# Patient Record
Sex: Male | Born: 1980
Health system: Southern US, Community
[De-identification: ages and names within clinical notes are randomized; demographics above are authoritative.]

## PROBLEM LIST (undated history)

## (undated) DIAGNOSIS — Q7959 Other congenital malformations of abdominal wall: Secondary | ICD-10-CM

## (undated) DIAGNOSIS — Q249 Congenital malformation of heart, unspecified: Secondary | ICD-10-CM

## (undated) DIAGNOSIS — M545 Low back pain, unspecified: Secondary | ICD-10-CM

## (undated) DIAGNOSIS — M25511 Pain in right shoulder: Secondary | ICD-10-CM

## (undated) DIAGNOSIS — R109 Unspecified abdominal pain: Secondary | ICD-10-CM

## (undated) DIAGNOSIS — K5909 Other constipation: Secondary | ICD-10-CM

## (undated) DIAGNOSIS — G8929 Other chronic pain: Secondary | ICD-10-CM

## (undated) DIAGNOSIS — Q7122 Congenital absence of both forearm and hand, left upper limb: Secondary | ICD-10-CM

## (undated) HISTORY — DX: Low back pain, unspecified: M54.50

## (undated) HISTORY — DX: Other congenital malformations of abdominal wall: Q79.59

## (undated) HISTORY — DX: Other chronic pain: G89.29

## (undated) HISTORY — DX: Pain in right shoulder: M25.511

## (undated) HISTORY — DX: Unspecified abdominal pain: R10.9

## (undated) HISTORY — PX: MOUTH SURGERY: SHX715

## (undated) HISTORY — DX: Congenital malformation of heart, unspecified: Q24.9

## (undated) HISTORY — PX: INGUINAL HERNIA REPAIR: SHX194

## (undated) HISTORY — DX: Other constipation: K59.09

## (undated) HISTORY — DX: Congenital absence of both forearm and hand, left upper limb: Q71.22

---

## 2004-08-05 DIAGNOSIS — Q249 Congenital malformation of heart, unspecified: Secondary | ICD-10-CM

## 2004-08-05 HISTORY — DX: Congenital malformation of heart, unspecified: Q24.9

## 2012-02-20 ENCOUNTER — Encounter: Payer: Self-pay | Admitting: Family Medicine

## 2012-02-20 ENCOUNTER — Ambulatory Visit (INDEPENDENT_AMBULATORY_CARE_PROVIDER_SITE_OTHER): Payer: 59 | Admitting: Family Medicine

## 2012-02-20 VITALS — BP 126/84 | HR 103 | Temp 97.7°F | Ht 69.0 in | Wt 179.0 lb

## 2012-02-20 DIAGNOSIS — K5909 Other constipation: Secondary | ICD-10-CM

## 2012-02-20 DIAGNOSIS — R1084 Generalized abdominal pain: Secondary | ICD-10-CM

## 2012-02-20 DIAGNOSIS — K5900 Constipation, unspecified: Secondary | ICD-10-CM

## 2012-02-20 DIAGNOSIS — G8929 Other chronic pain: Secondary | ICD-10-CM

## 2012-02-20 LAB — CBC WITH DIFFERENTIAL/PLATELET
Basophils Relative: 0.4 % (ref 0.0–3.0)
Eosinophils Relative: 2.5 % (ref 0.0–5.0)
HCT: 43.8 % (ref 39.0–52.0)
Hemoglobin: 14.8 g/dL (ref 13.0–17.0)
Lymphs Abs: 1.7 10*3/uL (ref 0.7–4.0)
Monocytes Relative: 11.4 % (ref 3.0–12.0)
Neutro Abs: 3.5 10*3/uL (ref 1.4–7.7)
Platelets: 126 10*3/uL — ABNORMAL LOW (ref 150.0–400.0)
RBC: 4.79 Mil/uL (ref 4.22–5.81)
WBC: 6.1 10*3/uL (ref 4.5–10.5)

## 2012-02-20 LAB — COMPREHENSIVE METABOLIC PANEL
BUN: 13 mg/dL (ref 6–23)
CO2: 29 mEq/L (ref 19–32)
GFR: 82.76 mL/min (ref >60.00)
Glucose, Bld: 98 mg/dL (ref 70–99)
Sodium: 140 mEq/L (ref 135–145)
Total Bilirubin: 0.8 mg/dL (ref 0.3–1.2)
Total Protein: 7.6 g/dL (ref 6.0–8.3)

## 2012-02-20 LAB — SEDIMENTATION RATE: Sed Rate: 8 mm/hr (ref 0–22)

## 2012-02-20 NOTE — Patient Instructions (Signed)
Please give patient directions (and address) to [facility].--thx Begin taking generic/store brand senakot-S, 2 tabs every night.

## 2012-02-20 NOTE — Progress Notes (Signed)
Office Note 02/22/2012  CC:  Chief Complaint  Patient presents with  . Establish Care    stomach problems    HPI:  William Prince is a 31 y.o. White male who is here to establish care. Patient's most recent primary MD: none Old records were not reviewed prior to or during today's visit.  Complains of approx 2 yr hx of lower abd pressure and cramps--he says it is constant with periods of acute worsening lasting a minute or two.  Sometimes BM comes after cramping and he has brown, formed stool---"thinner" caliber per pt (?).  No nausea.  Usually worse in morning when he wakes up.  It is somewhat relieved by passing gas.  No known trigger.  About a year ago he had a radiologic w/u (barium enema and CT scan) but never saw MD for it.  He was told it was probably intussuseption (his wife is u/s tech and had connections in radiology so he had testing w/out having to see MD).   No blood in stool, no pus in stool.  No wt loss.   During the last 2 yrs- stool pattern: 1 BM q 2-3 days, difficult to get out.  Has tried OTC laxative pill once--he can't recall the name.   No joint problems.  No heartburn.    Past Medical History  Diagnosis Date  . Chronic abdominal pain   . 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 Congenital inguinal hernia     bilat    Past Surgical History  Procedure Date  . Inguinal hernia repair as an infant    bilateral    Family History  Problem Relation Age of Onset  . Alcohol abuse Father   . Osteoarthritis Father   . Osteoarthritis Mother   . Heart disease Paternal Grandfather   . Alcohol abuse Paternal Grandfather     History   Social History  . Marital Status: Married    Spouse Name: N/A    Number of Children: N/A  . Years of Education: N/A   Occupational History  . Not on file.   Social History Main Topics  . Smoking status: Former Games developer  . Smokeless tobacco: Never Used  . Alcohol Use: Yes  . Drug Use: No    . Sexually Active: Not on file   Other Topics Concern  . Not on file   Social History Narrative   Married, has 4 children.Occupation: currently unemployed.  Has a little bit of college education.Relocated from [place] 12/2011.Tobacco 12 pack-yr hx; quit 2010.Rare alcohol intake.  No hx of drug use.No exercise.  Tries to eat healthy diet.    Outpatient Encounter Prescriptions as of 02/20/2012  Medication Sig Dispense Refill  . Multiple Vitamin (MULTIVITAMIN) tablet Take 1 tablet by mouth daily.        No Known Allergies  ROS Review of Systems  Constitutional: Negative for fever and fatigue.  HENT: Negative for congestion and sore throat.   Eyes: Negative for visual disturbance.  Respiratory: Negative for cough.   Cardiovascular: Negative for chest pain.  Gastrointestinal: Positive for abdominal pain and constipation. Negative for nausea, diarrhea, blood in stool, abdominal distention, anal bleeding and rectal pain.  Genitourinary: Negative for dysuria, urgency and flank pain.  Musculoskeletal: Negative for back pain and joint swelling.  Skin: Negative for rash.  Neurological: Negative for weakness and headaches.  Hematological: Negative for adenopathy.  Psychiatric/Behavioral: Negative for dysphoric mood.    PE; Blood pressure  126/84, pulse 103, temperature 97.7 F (36.5 C), temperature source Temporal, height 5\' 9"  (1.753 m), weight 179 lb (81.194 kg), SpO2 97.00%. Gen: Alert, well appearing.  Patient is oriented to person, place, time, and situation. Affect: pleasant, lucid thought and speech. ENT: Eyes: no injection, icteris, swelling, or exudate.  EOMI, PERRLA. Nose: no drainage or turbinate edema/swelling.  No injection or focal lesion.  Mouth: lips without lesion/swelling.  Oral mucosa pink and moist.  Oropharynx without erythema, exudate, or swelling.  Neck - No masses or thyromegaly or limitation in range of motion CV: RRR, no m/r/g.   LUNGS: CTA bilat, nonlabored  resps, good aeration in all lung fields. ABD: soft, nondistended, diffusely TTP except in RUQ or subxyphoid regions.  No guarding or rebound.  BS normal.  No HSM or bruit. EXT: no c/c/e.  Left arm--absence of forearm and hand. Musculoskeletal: no joint swelling, erythema, warmth, or tenderness.  ROM of all joints intact. Skin - no sores or suspicious lesions or rashes or color changes  Pertinent labs:  None today  ASSESSMENT AND PLAN:   New pt: no old records available (question of whether or not he or his wife could track down the imaging tests he got out of state.  Chronic generalized abdominal pain Sounds like functional GI pain. Will check plain x-rays--upright and supine--of abdomen for starters. Will check CBC, CMET, lipase, ESR, celiac panel, and hemoccults x 3. He does have chronic constipation, which I think is likely a separate issue, unlikely the cause of his current pain problems, but I recommended he start senakot-S generic 2 tabs qhs. Anticipate referral to GI for help with further evaluation.    Return if symptoms worsen or fail to improve.

## 2012-02-22 ENCOUNTER — Encounter: Payer: Self-pay | Admitting: Family Medicine

## 2012-02-22 DIAGNOSIS — K5909 Other constipation: Secondary | ICD-10-CM | POA: Insufficient documentation

## 2012-02-22 DIAGNOSIS — G8929 Other chronic pain: Secondary | ICD-10-CM | POA: Insufficient documentation

## 2012-02-22 LAB — GLIADIN ANTIBODIES, SERUM: Gliadin IgA: 3 U/mL (ref <20)

## 2012-02-22 NOTE — Assessment & Plan Note (Signed)
Sounds like functional GI pain. Will check plain x-rays--upright and supine--of abdomen for starters. Will check CBC, CMET, lipase, ESR, celiac panel, and hemoccults x 3. He does have chronic constipation, which I think is likely a separate issue, unlikely the cause of his current pain problems, but I recommended he start senakot-S generic 2 tabs qhs. Anticipate referral to GI for help with further evaluation.

## 2012-02-25 ENCOUNTER — Encounter: Payer: Self-pay | Admitting: Family Medicine

## 2012-02-25 ENCOUNTER — Ambulatory Visit (HOSPITAL_BASED_OUTPATIENT_CLINIC_OR_DEPARTMENT_OTHER)
Admission: RE | Admit: 2012-02-25 | Discharge: 2012-02-25 | Disposition: A | Discharge status: Home / Self Care | Payer: 59 | Source: Ambulatory Visit | Attending: Family Medicine | Admitting: Family Medicine

## 2012-02-25 DIAGNOSIS — G8929 Other chronic pain: Secondary | ICD-10-CM

## 2012-02-25 DIAGNOSIS — K5900 Constipation, unspecified: Secondary | ICD-10-CM | POA: Insufficient documentation

## 2012-02-25 DIAGNOSIS — R109 Unspecified abdominal pain: Secondary | ICD-10-CM | POA: Insufficient documentation

## 2012-02-25 DIAGNOSIS — K5909 Other constipation: Secondary | ICD-10-CM

## 2013-05-17 ENCOUNTER — Encounter: Payer: Self-pay | Admitting: Family Medicine

## 2013-05-17 ENCOUNTER — Ambulatory Visit (INDEPENDENT_AMBULATORY_CARE_PROVIDER_SITE_OTHER): Payer: 59 | Admitting: Family Medicine

## 2013-05-17 VITALS — BP 125/85 | HR 89 | Temp 98.6°F | Resp 16 | Ht 69.0 in | Wt 181.0 lb

## 2013-05-17 DIAGNOSIS — H6121 Impacted cerumen, right ear: Secondary | ICD-10-CM

## 2013-05-17 DIAGNOSIS — H9209 Otalgia, unspecified ear: Secondary | ICD-10-CM

## 2013-05-17 DIAGNOSIS — H9201 Otalgia, right ear: Secondary | ICD-10-CM

## 2013-05-17 DIAGNOSIS — H6120 Impacted cerumen, unspecified ear: Secondary | ICD-10-CM | POA: Insufficient documentation

## 2013-05-17 NOTE — Progress Notes (Signed)
OFFICE NOTE  05/17/2013  CC:  Chief Complaint  Patient presents with  . Otalgia    X Saturday right sided     HPI: Patient is a 32 y.o. Caucasian male who is here for right ear pain.   Onset about 2 d/a.  Hurts to touch it.  No recent cold/allergy sx's. Uses Q tips.  No ear drainage.  No fever.  No rash/color change to ear.  No ST.   Pertinent PMH:  Past Medical History  Diagnosis Date  . Chronic abdominal pain   . 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 Congenital inguinal hernia     bilat  . Chronic constipation    Past surgical, social, and family history reviewed and no changes noted since last office visit.  MEDS:  Outpatient Prescriptions Prior to Visit  Medication Sig Dispense Refill  . Multiple Vitamin (MULTIVITAMIN) tablet Take 1 tablet by mouth daily.       No facility-administered medications prior to visit.    PE: Blood pressure 125/85, pulse 89, temperature 98.6 F (37 C), temperature source Temporal, resp. rate 16, height 5\' 9"  (1.753 m), weight 181 lb (82.101 kg), SpO2 98.00%. ENT: Ears: Left EACs clear, normal epithelium, TM with good light reflex and landmarks.   Right EAC with no significant swelling or erythema or exudate of EAC.  He has a distal EAC cerumen impaction that I cannot see past.  The cerumen appears dark, hard/firm, and it appears to be adherent to the walls of his EAC.  He has tenderness to palpation of external ear and mild tenderness under the ear near the angle of the mandible.  Eyes: no injection, icteris, swelling, or exudate.  EOMI, PERRLA. Nose: no drainage or turbinate edema/swelling.  No injection or focal lesion.  Mouth: lips without lesion/swelling.  Oral mucosa pink and moist.    Oropharynx without erythema, exudate, or swelling.  Neck - No masses or thyromegaly or limitation in range of motion  IMPRESSION AND PLAN:  Otalgia of right ear I think this is due solely to his cerumen  impaction. Discussed cessation of Q tip use. Irrigation of right ear done today but was unsuccessful. He is in a lot of pain.  We have arranged for him to see ENT this afternoon so they can get this cleared out for him and also get a look at his TM/middle ear.   An After Visit Summary was printed and given to the patient.  FOLLOW UP: prn

## 2013-05-17 NOTE — Assessment & Plan Note (Addendum)
I think this is due solely to his cerumen impaction. Discussed cessation of Q tip use. Irrigation of right ear done today but was unsuccessful. He is in a lot of pain.  We have arranged for him to see ENT this afternoon so they can get this cleared out for him and also get a look at his TM/middle ear.

## 2013-09-16 ENCOUNTER — Telehealth: Payer: Self-pay | Admitting: Family Medicine

## 2013-09-16 NOTE — Telephone Encounter (Signed)
OK to transfer if OK with Dr Anitra Lauth

## 2013-09-16 NOTE — Telephone Encounter (Signed)
Patient would like to transfer care to Dr. Charlett Blake. Is this ok? Thanks!

## 2013-09-16 NOTE — Telephone Encounter (Signed)
Yes, fine with me. 

## 2013-09-17 NOTE — Telephone Encounter (Signed)
Left detailed message for patient wife to call the office to schedule new patient appointment with Dr. Charlett Blake

## 2013-12-14 ENCOUNTER — Ambulatory Visit: Payer: 59 | Admitting: Family Medicine

## 2014-01-14 ENCOUNTER — Ambulatory Visit: Payer: 59 | Admitting: Family Medicine

## 2014-01-14 DIAGNOSIS — Z0289 Encounter for other administrative examinations: Secondary | ICD-10-CM

## 2014-03-07 ENCOUNTER — Ambulatory Visit: Payer: 59 | Admitting: Family Medicine

## 2014-03-09 ENCOUNTER — Ambulatory Visit (INDEPENDENT_AMBULATORY_CARE_PROVIDER_SITE_OTHER): Payer: 59 | Admitting: Family Medicine

## 2014-03-09 ENCOUNTER — Encounter: Payer: Self-pay | Admitting: Family Medicine

## 2014-03-09 VITALS — BP 130/81 | HR 83 | Temp 98.8°F | Resp 18 | Ht 69.0 in | Wt 156.0 lb

## 2014-03-09 DIAGNOSIS — F32A Depression, unspecified: Secondary | ICD-10-CM | POA: Insufficient documentation

## 2014-03-09 DIAGNOSIS — R634 Abnormal weight loss: Secondary | ICD-10-CM | POA: Insufficient documentation

## 2014-03-09 DIAGNOSIS — R109 Unspecified abdominal pain: Secondary | ICD-10-CM

## 2014-03-09 DIAGNOSIS — F329 Major depressive disorder, single episode, unspecified: Secondary | ICD-10-CM | POA: Insufficient documentation

## 2014-03-09 DIAGNOSIS — F341 Dysthymic disorder: Secondary | ICD-10-CM

## 2014-03-09 DIAGNOSIS — F419 Anxiety disorder, unspecified: Secondary | ICD-10-CM

## 2014-03-09 DIAGNOSIS — R1030 Lower abdominal pain, unspecified: Secondary | ICD-10-CM

## 2014-03-09 LAB — COMPREHENSIVE METABOLIC PANEL
ALT: 18 U/L (ref 0–53)
AST: 18 U/L (ref 0–37)
Albumin: 4.7 g/dL (ref 3.5–5.2)
Alkaline Phosphatase: 45 U/L (ref 39–117)
BUN: 13 mg/dL (ref 6–23)
CO2: 28 mEq/L (ref 19–32)
CREATININE: 1 mg/dL (ref 0.4–1.5)
Calcium: 9.8 mg/dL (ref 8.4–10.5)
Chloride: 102 mEq/L (ref 96–112)
GFR: 89.01 mL/min (ref >60.00)
GLUCOSE: 81 mg/dL (ref 70–99)
Potassium: 4.4 mEq/L (ref 3.5–5.1)
Sodium: 138 mEq/L (ref 135–145)
Total Bilirubin: 1.1 mg/dL (ref 0.2–1.2)
Total Protein: 7.2 g/dL (ref 6.0–8.3)

## 2014-03-09 LAB — CBC WITH DIFFERENTIAL/PLATELET
BASOS ABS: 0 10*3/uL (ref 0.0–0.1)
Basophils Relative: 0.4 % (ref 0.0–3.0)
EOS PCT: 0.8 % (ref 0.0–5.0)
Eosinophils Absolute: 0 10*3/uL (ref 0.0–0.7)
HCT: 41.9 % (ref 39.0–52.0)
Hemoglobin: 14.1 g/dL (ref 13.0–17.0)
LYMPHS ABS: 1.4 10*3/uL (ref 0.7–4.0)
Lymphocytes Relative: 24.3 % (ref 12.0–46.0)
MCHC: 33.5 g/dL (ref 30.0–36.0)
MCV: 91.3 fl (ref 78.0–100.0)
MONOS PCT: 8.1 % (ref 3.0–12.0)
Monocytes Absolute: 0.5 10*3/uL (ref 0.1–1.0)
NEUTROS ABS: 4 10*3/uL (ref 1.4–7.7)
Neutrophils Relative %: 66.4 % (ref 43.0–77.0)
Platelets: 137 10*3/uL — ABNORMAL LOW (ref 150.0–400.0)
RBC: 4.58 Mil/uL (ref 4.22–5.81)
RDW: 13.1 % (ref 11.5–15.5)
WBC: 6 10*3/uL (ref 4.0–10.5)

## 2014-03-09 LAB — C-REACTIVE PROTEIN

## 2014-03-09 LAB — SEDIMENTATION RATE: SED RATE: 5 mm/h (ref 0–22)

## 2014-03-09 LAB — TSH: TSH: 1.12 u[IU]/mL (ref 0.35–4.50)

## 2014-03-09 LAB — LIPASE: Lipase: 47 U/L (ref 11.0–59.0)

## 2014-03-09 NOTE — Assessment & Plan Note (Signed)
Chronic, now with loss of appetite and significant wt loss. Will check some general labs again today: CBC, CMET, TSH, ESR/CRP, lipase, plus get hemoccults x 3. We have done similar testing in the past plus blood testing for celiac dz and all were unremarkable.   I will check an abd/pelvic CT with contrast. If my w/u is unrevealing then I will ask GI to see him for further evaluation and management.

## 2014-03-09 NOTE — Assessment & Plan Note (Signed)
Pt says this is mild, says he is not interested in meds. We agreed this may impact his appetite and occ lead to wt loss, but this would be a diagnosis of exclusion---must r/o organic dz first.

## 2014-03-09 NOTE — Progress Notes (Signed)
Pre visit review using our clinic review tool, if applicable. No additional management support is needed unless otherwise documented below in the visit note. 

## 2014-03-09 NOTE — Progress Notes (Signed)
OFFICE VISIT  03/09/2014  CC:  Chief Complaint  Patient presents with  . Abdominal Pain  . Weight Loss  . Weight Loss    around 30 lbs in the last 3 months   HPI:    Patient is a 33 y.o. Caucasian male who presents for ongoing wt loss. He has a history of chronic/recurrent pain in lower abd + chronic constipation--this is unchanged.  Describes a constant nagging pain with some intermittent worsening.   He feels a firmness in LLQ.  Has one BM qd or qod, sometimes it is hard but usually it is not.  Stool is brown.  No n/v/d. New sx's: last couple months has had markedly decreased appetite, forces himself to eat.  He doesn't note any particular pattern/trigger to his abd pains, no partic types of foods are intolerable compared to others.   Wt loss over the last 3-4 mo has been remarkable: 25-30 lbs--NONPURPOSEFUL.   Feels drained and fatigued all the time.  No fevers.    Past Medical History  Diagnosis Date  . Chronic abdominal pain   . 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 Congenital inguinal hernia     bilat  . Chronic constipation     Past Surgical History  Procedure Laterality Date  . Inguinal hernia repair  as an infant    bilateral    MEDS: none  Allergies  Allergen Reactions  . Naproxen     Patient had burning pain in stomach, had to have his stomach pumped.    ROS Review of Systems  Constitutional: Positive for appetite change and fatigue. Negative for fever.  HENT: Negative for congestion, hearing loss, mouth sores, sinus pressure and trouble swallowing.   Eyes: Negative for visual disturbance.  Respiratory: Negative for cough and shortness of breath.   Cardiovascular: Negative for chest pain, palpitations and leg swelling.  Gastrointestinal: Positive for nausea (occasional) and constipation. Negative for vomiting, diarrhea, blood in stool and abdominal distention.  Endocrine: Negative for polydipsia and polyuria.   Genitourinary: Negative for dysuria, frequency and hematuria.  Musculoskeletal: Negative for arthralgias and myalgias.  Skin: Negative for rash.  Neurological: Positive for headaches (occ). Negative for dizziness and weakness.  Psychiatric/Behavioral: Positive for suicidal ideas. Negative for sleep disturbance (hard to maintain sleep), dysphoric mood and decreased concentration. The patient is not nervous/anxious (no panic).      PE: Blood pressure 130/81, pulse 83, temperature 98.8 F (37.1 C), temperature source Temporal, resp. rate 18, height '5\' 9"'  (1.753 m), weight 156 lb (70.761 kg), SpO2 98.00%. Gen: Alert, well appearing.  Patient is oriented to person, place, time, and situation. YSH:UOHF: no injection, icteris, swelling, or exudate.  EOMI, PERRLA. Mouth: lips without lesion/swelling.  Oral mucosa pink and moist. Oropharynx without erythema, exudate, or swelling.  Neck - No masses or thyromegaly or limitation in range of motion CV: RRR, no m/r/g.   LUNGS: CTA bilat, nonlabored resps, good aeration in all lung fields. ABD: soft, nondistended.  BS normal.  No HSM or mass.  He has significant TTP across mid abdomen and in both lower quadrants diffusely, with some voluntary guarding but no rebound.  He has mid-epigastric area TTP.  Not tender in RUQ or LUQ. EXT: no clubbing, cyanosis, or edema.  Skin - no sores or suspicious lesions or rashes or color changes Musculoskeletal: no joint swelling, erythema, warmth, or tenderness.  ROM of all joints intact.   LABS:  None today  IMPRESSION  AND PLAN:  Lower abdominal pain Chronic, now with loss of appetite and significant wt loss. Will check some general labs again today: CBC, CMET, TSH, ESR/CRP, lipase, plus get hemoccults x 3. We have done similar testing in the past plus blood testing for celiac dz and all were unremarkable.   I will check an abd/pelvic CT with contrast. If my w/u is unrevealing then I will ask GI to see him for  further evaluation and management.  Anxiety and depression Pt says this is mild, says he is not interested in meds. We agreed this may impact his appetite and occ lead to wt loss, but this would be a diagnosis of exclusion---must r/o organic dz first.   An After Visit Summary was printed and given to the patient.  FOLLOW UP: Return in about 1 month (around 04/09/2014) for f/u abd pain/wt loss.

## 2014-04-06 ENCOUNTER — Ambulatory Visit: Payer: 59 | Admitting: Family Medicine

## 2014-04-06 DIAGNOSIS — Z0289 Encounter for other administrative examinations: Secondary | ICD-10-CM

## 2014-04-08 ENCOUNTER — Other Ambulatory Visit: Payer: Self-pay | Admitting: Family Medicine

## 2014-04-08 ENCOUNTER — Ambulatory Visit (HOSPITAL_BASED_OUTPATIENT_CLINIC_OR_DEPARTMENT_OTHER)
Admission: RE | Admit: 2014-04-08 | Discharge: 2014-04-08 | Disposition: A | Discharge status: Home / Self Care | Payer: 59 | Source: Ambulatory Visit | Attending: Family Medicine | Admitting: Family Medicine

## 2014-04-08 DIAGNOSIS — R634 Abnormal weight loss: Secondary | ICD-10-CM

## 2014-04-08 DIAGNOSIS — R1030 Lower abdominal pain, unspecified: Secondary | ICD-10-CM

## 2014-04-08 DIAGNOSIS — R109 Unspecified abdominal pain: Secondary | ICD-10-CM

## 2014-04-08 MED ORDER — IOHEXOL 300 MG/ML  SOLN
100.0000 mL | Freq: Once | INTRAMUSCULAR | Status: AC | PRN
  Administered 2014-04-08: 100 mL via INTRAVENOUS

## 2014-08-16 ENCOUNTER — Telehealth: Payer: Self-pay | Admitting: Family Medicine

## 2014-08-16 DIAGNOSIS — R634 Abnormal weight loss: Secondary | ICD-10-CM

## 2014-08-16 DIAGNOSIS — R109 Unspecified abdominal pain: Secondary | ICD-10-CM

## 2014-08-16 NOTE — Telephone Encounter (Signed)
Yes.  New GI referral order entered.

## 2014-08-16 NOTE — Telephone Encounter (Signed)
Patient does not check his phone for messages. His wife just checked & noticed that [facility] has been trying to schedule a an appt. Patient does want to have colonoscopy however referral has been cancelled. Can a new referral be put in?

## 2014-08-16 NOTE — Telephone Encounter (Signed)
Please advise 

## 2014-08-17 ENCOUNTER — Encounter: Payer: Self-pay | Admitting: Internal Medicine

## 2014-08-24 ENCOUNTER — Ambulatory Visit (INDEPENDENT_AMBULATORY_CARE_PROVIDER_SITE_OTHER): Payer: 59 | Admitting: Internal Medicine

## 2014-08-24 ENCOUNTER — Other Ambulatory Visit (INDEPENDENT_AMBULATORY_CARE_PROVIDER_SITE_OTHER): Payer: 59

## 2014-08-24 ENCOUNTER — Encounter: Payer: Self-pay | Admitting: Internal Medicine

## 2014-08-24 VITALS — BP 122/74 | HR 84 | Ht 68.5 in | Wt 153.0 lb

## 2014-08-24 DIAGNOSIS — R194 Change in bowel habit: Secondary | ICD-10-CM

## 2014-08-24 DIAGNOSIS — R109 Unspecified abdominal pain: Secondary | ICD-10-CM

## 2014-08-24 DIAGNOSIS — R634 Abnormal weight loss: Secondary | ICD-10-CM

## 2014-08-24 LAB — CBC WITH DIFFERENTIAL/PLATELET
BASOS PCT: 0.5 % (ref 0.0–3.0)
Basophils Absolute: 0 10*3/uL (ref 0.0–0.1)
EOS PCT: 1.4 % (ref 0.0–5.0)
Eosinophils Absolute: 0.1 10*3/uL (ref 0.0–0.7)
HEMATOCRIT: 43.8 % (ref 39.0–52.0)
Hemoglobin: 15.2 g/dL (ref 13.0–17.0)
Lymphocytes Relative: 25.9 % (ref 12.0–46.0)
Lymphs Abs: 1.8 10*3/uL (ref 0.7–4.0)
MCHC: 34.8 g/dL (ref 30.0–36.0)
MCV: 89.2 fl (ref 78.0–100.0)
MONO ABS: 0.6 10*3/uL (ref 0.1–1.0)
Monocytes Relative: 8.4 % (ref 3.0–12.0)
NEUTROS PCT: 63.8 % (ref 43.0–77.0)
Neutro Abs: 4.4 10*3/uL (ref 1.4–7.7)
Platelets: 149 10*3/uL — ABNORMAL LOW (ref 150.0–400.0)
RBC: 4.91 Mil/uL (ref 4.22–5.81)
RDW: 13.3 % (ref 11.5–15.5)
WBC: 6.8 10*3/uL (ref 4.0–10.5)

## 2014-08-24 LAB — IGA: IGA: 133 mg/dL (ref 68–378)

## 2014-08-24 LAB — HIGH SENSITIVITY CRP: CRP, High Sensitivity: 0.63 mg/L (ref 0.000–5.000)

## 2014-08-24 MED ORDER — MOVIPREP 100 G PO SOLR: 1.0000 | Freq: Once | ORAL | Status: DC

## 2014-08-24 NOTE — Progress Notes (Signed)
Patient ID: William Prince, male   DOB: August 08, 1980, 34 y.o.   MRN: [id number] HPI: William Prince is a 34 yo male with PMH of chronic abdominal pain, anxiety who is seen in consultation at the request of Dr. Anitra Lauth to evaluate weight loss, change in bowel habits and abdominal pain. He is here alone today. He reports he's had somewhat long-standing diffuse abdominal pain, though this seems to be worse of late. He describes it as a nagging and gnawing mid and at times diffuse abdominal discomfort. It is there almost every day and occasionally gets worse with eating. It does seem to get better after bowel movement. His appetite has fluctuated greatly and he reports at times he can eat full meals and at other times he is not hungry and skips meals. He has lost about 30 pounds in the last 6-7 months and this is unintentional. He reports his stools have changed to become flat and ribbonlike with a different odor. He reports they are brown in color and denies blood in his stool or melena. He feels occasionally like pressing on his abdomen helps with passing his bowel movements. He did have a CT scan performed by primary care in September which was unremarkable. He reports frequent nocturnal urination 3 times a night which is new for him but denies dysuria or hematuria. He has some mild night sweats but not drenching requiring change of clothes or bedding  Family history is fairly unremarkable from a GI perspective. He is married with 5 children  Past Medical History  Diagnosis Date  . Chronic abdominal pain   . 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 Congenital inguinal hernia     bilat  . Chronic constipation   . Cardiac arrhythmia due to congenital heart disease 2006    Past Surgical History  Procedure Laterality Date  . Inguinal hernia repair Bilateral as an infant    bilateral  . Mouth surgery      Outpatient Prescriptions Prior to Visit  Medication Sig Dispense  Refill  . Multiple Vitamin (MULTIVITAMIN) tablet Take 1 tablet by mouth daily.     No facility-administered medications prior to visit.    Allergies  Allergen Reactions  . Naproxen     Patient had burning pain in stomach, had to have his stomach pumped.    Family History  Problem Relation Age of Onset  . Osteoarthritis Father   . Osteoarthritis Mother   . Heart disease Paternal Grandfather   . Alcohol abuse Paternal Grandfather   . Kidney disease Paternal Grandfather   . Colon cancer Neg Hx   . Colon polyps Neg Hx   . Esophageal cancer Neg Hx   . Gallbladder disease Neg Hx   . Cholelithiasis Mother     History  Substance Use Topics  . Smoking status: Former Smoker -- 1.50 packs/day for 15 years    Types: Cigarettes    Quit date: 08/05/2005  . Smokeless tobacco: Never Used  . Alcohol Use: 0.0 oz/week    0 Not specified per week     Comment: Occassionally    ROS: As per history of present illness, otherwise negative  BP 122/74 mmHg  Pulse 84  Ht 5' 8.5" (1.74 m)  Wt 153 lb (69.4 kg)  BMI 22.92 kg/m2 Constitutional: Well-developed and well-nourished. No distress. HEENT: Normocephalic and atraumatic. Oropharynx is clear and moist. No oropharyngeal exudate. Conjunctivae are normal.  No scleral icterus. Neck: Neck supple. Trachea midline.  Cardiovascular: Normal rate, regular rhythm and intact distal pulses. No M/R/G Pulmonary/chest: Effort normal and breath sounds normal. No wheezing, rales or rhonchi. Abdominal: Soft, diffuse tenderness without rebound or guarding, nondistended. Bowel sounds active throughout. There are no masses palpable. No hepatosplenomegaly. Extremities: no clubbing, cyanosis, or edema, congenital absence of the left forearm and hand Lymphadenopathy: No cervical adenopathy noted. Neurological: Alert and oriented to person place and time. Skin: Skin is warm and dry. No rashes noted. Psychiatric: Normal mood and affect. Behavior is  normal.  RELEVANT LABS AND IMAGING: CBC    Component Value Date/Time   WBC 6.8 08/24/2014 1103   RBC 4.91 08/24/2014 1103   HGB 15.2 08/24/2014 1103   HCT 43.8 08/24/2014 1103   PLT 149.0* 08/24/2014 1103   MCV 89.2 08/24/2014 1103   MCHC 34.8 08/24/2014 1103   RDW 13.3 08/24/2014 1103   LYMPHSABS 1.8 08/24/2014 1103   MONOABS 0.6 08/24/2014 1103   EOSABS 0.1 08/24/2014 1103   BASOSABS 0.0 08/24/2014 1103    CMP     Component Value Date/Time   NA 138 03/09/2014 1150   K 4.4 03/09/2014 1150   CL 102 03/09/2014 1150   CO2 28 03/09/2014 1150   GLUCOSE 81 03/09/2014 1150   BUN 13 03/09/2014 1150   CREATININE 1.0 03/09/2014 1150   CALCIUM 9.8 03/09/2014 1150   PROT 7.2 03/09/2014 1150   ALBUMIN 4.7 03/09/2014 1150   AST 18 03/09/2014 1150   ALT 18 03/09/2014 1150   ALKPHOS 45 03/09/2014 1150   BILITOT 1.1 03/09/2014 1150   CLINICAL DATA:  Chronic lower abdominal pain.  30 lb weight loss.   EXAM: CT ABDOMEN AND PELVIS WITH CONTRAST   TECHNIQUE: Multidetector CT imaging of the abdomen and pelvis was performed using the standard protocol following bolus administration of intravenous contrast.   CONTRAST:  153mL OMNIPAQUE IOHEXOL 300 MG/ML  SOLN   COMPARISON:  Plain film 02/25/2012   FINDINGS: Lung bases:Lung bases are clear. heart is normal size. No effusions.   Solid organs: Liver, spleen, pancreas, adrenals, kidneys are unremarkable.   Gallbladder/Biliary:  Unremarkable.   Bowel/Peritoneum: No evidence of bowel wall thickening, mass, or obstruction. Normal appendix.   Pelvic/Reproductive Organs: No mass or other significant abnormality identified.   Aorta/retroperitoneum: Aorta is normal caliber. No adenopathy.   Musculoskeletal:  No suspicious bone lesions identified.   Other:  None.   IMPRESSION: Unremarkable abdominal/pelvic CT.     Electronically Signed   By: Rolm Baptise M.D.   On: 04/08/2014 10:52  ASSESSMENT/PLAN: 34 yo male with PMH  of chronic abdominal pain, anxiety who is seen in consultation at the request of Dr. Anitra Lauth to evaluate weight loss, change in bowel habits and abdominal pain.  1. Abd pain/change in bowel habits/unintentional weight loss -- his weight loss is concerning and unexplained. Labs performed in September were unremarkable. His CT scan was reviewed and unremarkable. I have recommended celiac panel as well as a repeat CBC. I will check high-sensitivity CRP is a marker of inflammation. Given his symptomatology I recommended upper and lower endoscopy for direct visualization to exclude inflammatory bowel disease but also an occult malignancy. The latter is felt less likely given his normal blood counts though change in bowel habit and weight loss again are concerning. We discussed the test including the risks and benefits and he is agreeable to proceed. Irritable bowel is likely component to some of his symptoms possibly exacerbated by anxiety though weight loss would certainly not be  expected with IBS alone.

## 2014-08-24 NOTE — Patient Instructions (Signed)
You have been scheduled for an endoscopy and colonoscopy. Please follow the written instructions given to you at your visit today. Please pick up your prep at the pharmacy within the next 1-3 days. If you use inhalers (even only as needed), please bring them with you on the day of your procedure. Your physician has requested that you go to www.startemmi.com and enter the access code given to you at your visit today. This web site gives a general overview about your procedure. However, you should still follow specific instructions given to you by our office regarding your preparation for the procedure.  Your physician has requested that you go to the basement for the following lab work before leaving today: Celiac, CRP, CBC  CC:Dr McGowen

## 2014-08-25 LAB — TISSUE TRANSGLUTAMINASE, IGA: Tissue Transglutaminase Ab, IgA: 1 U/mL (ref <4)

## 2014-09-26 ENCOUNTER — Ambulatory Visit (AMBULATORY_SURGERY_CENTER): Payer: 59 | Admitting: Internal Medicine

## 2014-09-26 ENCOUNTER — Encounter: Payer: Self-pay | Admitting: Internal Medicine

## 2014-09-26 VITALS — BP 113/50 | HR 79 | Temp 97.3°F | Resp 12 | Ht 68.0 in | Wt 153.0 lb

## 2014-09-26 DIAGNOSIS — D120 Benign neoplasm of cecum: Secondary | ICD-10-CM

## 2014-09-26 DIAGNOSIS — K2970 Gastritis, unspecified, without bleeding: Secondary | ICD-10-CM

## 2014-09-26 DIAGNOSIS — R1084 Generalized abdominal pain: Secondary | ICD-10-CM

## 2014-09-26 DIAGNOSIS — K2990 Gastroduodenitis, unspecified, without bleeding: Secondary | ICD-10-CM

## 2014-09-26 DIAGNOSIS — K2950 Unspecified chronic gastritis without bleeding: Secondary | ICD-10-CM

## 2014-09-26 DIAGNOSIS — G8929 Other chronic pain: Secondary | ICD-10-CM

## 2014-09-26 DIAGNOSIS — R634 Abnormal weight loss: Secondary | ICD-10-CM

## 2014-09-26 DIAGNOSIS — R109 Unspecified abdominal pain: Secondary | ICD-10-CM

## 2014-09-26 HISTORY — PX: COLONOSCOPY: SHX174

## 2014-09-26 HISTORY — PX: ESOPHAGOGASTRODUODENOSCOPY: SHX1529

## 2014-09-26 MED ORDER — SODIUM CHLORIDE 0.9 % IV SOLN: 500.0000 mL | INTRAVENOUS | Status: DC

## 2014-09-26 NOTE — Progress Notes (Signed)
A/ox3, pleased with MAC, report to RN 

## 2014-09-26 NOTE — Op Note (Signed)
[facility]  [address]. [place], [address]   ENDOSCOPY PROCEDURE REPORT  PATIENT: William Prince, William Prince  MR#: [id number] BIRTHDATE: March 03, 1981 , 33  yrs. old GENDER: male ENDOSCOPIST: Jerene Bears, MD REFERRED BY:  Ricardo Jericho, M.D. PROCEDURE DATE:  09/26/2014 PROCEDURE:  EGD w/ biopsy for H.pylori ASA CLASS:     Class II INDICATIONS:  weight loss and abdominal pain. MEDICATIONS: Monitored anesthesia care and Propofol 250 mg IV TOPICAL ANESTHETIC: none  DESCRIPTION OF PROCEDURE: After the risks benefits and alternatives of the procedure were thoroughly explained, informed consent was obtained.  The LB [id number] endoscope was introduced through the mouth and advanced to the second portion of the duodenum , Without limitations.  The instrument was slowly withdrawn as the mucosa was fully examined.   ESOPHAGUS: The mucosa of the esophagus appeared normal.  STOMACH: Mild gastritis (inflammation) with several erosions was found in the gastric antrum.  Cold forcep biopsies were taken at the gastric body, antrum and angularis to evaluate for h.  pylori.   DUODENUM: The duodenal mucosa showed no abnormalities in the bulb and 2nd part of the duodenum.  Retroflexed views revealed no abnormalities.     The scope was then withdrawn from the patient and the procedure completed.  COMPLICATIONS: There were no immediate complications.  ENDOSCOPIC IMPRESSION: 1.   The mucosa of the esophagus appeared normal 2.   Gastritis (inflammation) was found in the gastric antrum; multiple biopsies 3.   The duodenal mucosa showed no abnormalities in the bulb and 2nd part of the duodenum  RECOMMENDATIONS: 1.  Await biopsy results 2.  Proceed with a Colonoscopy.  [eSigned:  Jerene Bears, MD 09/26/2014 2:03 PM   MB:[id number] McGowen, MD and The Patient

## 2014-09-26 NOTE — Patient Instructions (Signed)
YOU HAD AN ENDOSCOPIC PROCEDURE TODAY AT THE [facility]: Refer to the procedure report that was given to you for any specific questions about what was found during the examination.  If the procedure report does not answer your questions, please call your gastroenterologist to clarify.  If you requested that your care partner not be given the details of your procedure findings, then the procedure report has been included in a sealed envelope for you to review at your convenience later.  YOU SHOULD EXPECT: Some feelings of bloating in the abdomen. Passage of more gas than usual.  Walking can help get rid of the air that was put into your GI tract during the procedure and reduce the bloating. If you had a lower endoscopy (such as a colonoscopy or flexible sigmoidoscopy) you may notice spotting of blood in your stool or on the toilet paper. If you underwent a bowel prep for your procedure, then you may not have a normal bowel movement for a few days.  DIET: Your first meal following the procedure should be a light meal and then it is ok to progress to your normal diet.  A half-sandwich or bowl of soup is an example of a good first meal.  Heavy or fried foods are harder to digest and may make you feel nauseous or bloated.  Likewise meals heavy in dairy and vegetables can cause extra gas to form and this can also increase the bloating.  Drink plenty of fluids but you should avoid alcoholic beverages for 24 hours.  ACTIVITY: Your care partner should take you home directly after the procedure.  You should plan to take it easy, moving slowly for the rest of the day.  You can resume normal activity the day after the procedure however you should NOT DRIVE or use heavy machinery for 24 hours (because of the sedation medicines used during the test).    SYMPTOMS TO REPORT IMMEDIATELY: A gastroenterologist can be reached at any hour.  During normal business hours, 8:30 AM to 5:00 PM Monday through Friday,  call [phone].  After hours and on weekends, please call the GI answering service at [phone] who will take a message and have the physician on call contact you.   Following lower endoscopy (colonoscopy or flexible sigmoidoscopy):  Excessive amounts of blood in the stool  Significant tenderness or worsening of abdominal pains  Swelling of the abdomen that is new, acute  Fever of 100F or higher  Following upper endoscopy (EGD)  Vomiting of blood or coffee ground material  New chest pain or pain under the shoulder blades  Painful or persistently difficult swallowing  New shortness of breath  Fever of 100F or higher  Black, tarry-looking stools  FOLLOW UP: If any biopsies were taken you will be contacted by phone or by letter within the next 1-3 weeks.  Call your gastroenterologist if you have not heard about the biopsies in 3 weeks.  Our staff will call the home number listed on your records the next business day following your procedure to check on you and address any questions or concerns that you may have at that time regarding the information given to you following your procedure. This is a courtesy call and so if there is no answer at the home number and we have not heard from you through the emergency physician on call, we will assume that you have returned to your regular daily activities without incident.  SIGNATURES/CONFIDENTIALITY: You and/or your care   partner have signed paperwork which will be entered into your electronic medical record.  These signatures attest to the fact that that the information above on your After Visit Summary has been reviewed and is understood.  Full responsibility of the confidentiality of this discharge information lies with you and/or your care-partner.  

## 2014-09-26 NOTE — Op Note (Signed)
[facility]  [address] [place], [id number]   COLONOSCOPY PROCEDURE REPORT  PATIENT: [name], [name]  MR#: [id number] BIRTHDATE: 23-May-1981 , 33  yrs. old GENDER: male ENDOSCOPIST: Jerene Bears, MD REFERRED QD:IYMEBRA Anitra Lauth, M.D. PROCEDURE DATE:  09/26/2014 PROCEDURE:   Colonoscopy with snare polypectomy First Screening Colonoscopy - Avg.  risk and is 50 yrs.  old or older - No.  Prior Negative Screening - Now for repeat screening. N/A  History of Adenoma - Now for follow-up colonoscopy & has been > or = to 3 yrs.  N/A  Polyps Removed Today? Yes. ASA CLASS:   Class II INDICATIONS:abdominal pain and weight loss. MEDICATIONS: Monitored anesthesia care, Propofol 100 mg IV, and Residual sedation present  DESCRIPTION OF PROCEDURE:   After the risks benefits and alternatives of the procedure were thoroughly explained, informed consent was obtained.  The digital rectal exam revealed no rectal mass.   The LB [id number]  endoscope was introduced through the anus and advanced to the terminal ileum which was intubated for a short distance. No adverse events experienced.   The quality of the prep was good, using MoviPrep  The instrument was then slowly withdrawn as the colon was fully examined.   COLON FINDINGS: The examined terminal ileum appeared to be normal. A flat polyp measuring 6 mm in size with a mucous cap was found at the cecum.  A polypectomy was performed with a cold snare.  The resection was complete, the polyp tissue was completely retrieved and sent to histology.   The examination was otherwise normal. Retroflexed views revealed no abnormalities. The time to cecum=2 minutes 03 seconds.  Withdrawal time=9 minutes 07 seconds.  The scope was withdrawn and the procedure completed.  COMPLICATIONS: There were no immediate complications.  ENDOSCOPIC IMPRESSION: 1.   The examined terminal ileum appeared to be normal 2.   Flat polyp was found at  the cecum; polypectomy was performed with a cold snare 3.   The examination was otherwise normal   RECOMMENDATIONS: 1.  Await pathology results 2.  Timing of repeat colonoscopy will be determined by pathology findings. 3.  You will receive a letter within 1-2 weeks with the results of your biopsy as well as final recommendations.  Please call my office if you have not received a letter after 3 weeks.  eSigned:  Jerene Bears, MD 09/26/2014 2:07 PM   cc: Ricardo Jericho, MD and The Patient   PATIENT NAME:  William Prince, William Prince MR#: [id number]

## 2014-09-26 NOTE — Progress Notes (Signed)
Called to room to assist during endoscopic procedure.  Patient ID and intended procedure confirmed with present staff. Received instructions for my participation in the procedure from the performing physician.  

## 2014-09-27 ENCOUNTER — Telehealth: Payer: Self-pay | Admitting: *Deleted

## 2014-09-27 NOTE — Telephone Encounter (Signed)
Message left

## 2014-09-29 ENCOUNTER — Encounter: Payer: Self-pay | Admitting: Internal Medicine

## 2014-10-11 ENCOUNTER — Encounter: Payer: Self-pay | Admitting: Internal Medicine

## 2015-11-04 ENCOUNTER — Encounter (HOSPITAL_COMMUNITY): Payer: Self-pay | Admitting: Emergency Medicine

## 2015-11-04 ENCOUNTER — Emergency Department (HOSPITAL_COMMUNITY)
Admission: EM | Admit: 2015-11-04 | Discharge: 2015-11-04 | Disposition: A | Discharge status: Home / Self Care | Payer: 59 | Attending: Emergency Medicine | Admitting: Emergency Medicine

## 2015-11-04 ENCOUNTER — Emergency Department (HOSPITAL_COMMUNITY): Payer: 59

## 2015-11-04 DIAGNOSIS — Z87891 Personal history of nicotine dependence: Secondary | ICD-10-CM | POA: Diagnosis not present

## 2015-11-04 DIAGNOSIS — Q248 Other specified congenital malformations of heart: Secondary | ICD-10-CM | POA: Diagnosis not present

## 2015-11-04 DIAGNOSIS — M7989 Other specified soft tissue disorders: Secondary | ICD-10-CM | POA: Diagnosis not present

## 2015-11-04 DIAGNOSIS — Z79899 Other long term (current) drug therapy: Secondary | ICD-10-CM | POA: Insufficient documentation

## 2015-11-04 DIAGNOSIS — G8929 Other chronic pain: Secondary | ICD-10-CM | POA: Diagnosis not present

## 2015-11-04 DIAGNOSIS — Q7122 Congenital absence of both forearm and hand, left upper limb: Secondary | ICD-10-CM | POA: Insufficient documentation

## 2015-11-04 DIAGNOSIS — M79672 Pain in left foot: Secondary | ICD-10-CM | POA: Diagnosis not present

## 2015-11-04 MED ORDER — OXYCODONE-ACETAMINOPHEN 5-325 MG PO TABS: 1.0000 | ORAL_TABLET | ORAL | Status: DC | PRN

## 2015-11-04 MED ORDER — KETOROLAC TROMETHAMINE 60 MG/2ML IM SOLN
60.0000 mg | Freq: Once | INTRAMUSCULAR | Status: AC
  Administered 2015-11-04: 60 mg via INTRAMUSCULAR
  Filled 2015-11-04: qty 2

## 2015-11-04 MED ORDER — IBUPROFEN 800 MG PO TABS: 800.0000 mg | ORAL_TABLET | Freq: Three times a day (TID) | ORAL | Status: DC

## 2015-11-04 NOTE — Discharge Instructions (Signed)
You have been seen today for foot pain. Your imaging showed no abnormalities. Use ibuprofen for pain relief and to reduce inflammation. Take 800 mg of ibuprofen every 8 hours for the next 3 days. Take this medication with food to avoid an upset stomach. Ice may also help reduce inflammation. Follow up with PCP as needed if symptoms continue. Return to ED should symptoms worsen.

## 2015-11-04 NOTE — ED Provider Notes (Signed)
CSN: [id number]     Arrival date & time 11/04/15  0025 History   First MD Initiated Contact with Patient 11/04/15 0040     Chief Complaint  Patient presents with  . Foot Injury     (Consider location/radiation/quality/duration/timing/severity/associated sxs/prior Treatment) HPI   William Prince is a 35 y.o. male, patient with no pertinent past medical history, presenting to the ED with Left foot pain that began earlier tonight. Patient states that he was moving furniture today and his pain began after he had finished. Patient denies falls or trauma. Rates his pain 7 out of 10, throbbing, nonradiating. Pain is located on the lateral left foot and ankle. Patient denies neuro deficits, other injuries, or any other complaints.    Past Medical History  Diagnosis Date  . Chronic abdominal pain   . 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 Congenital inguinal hernia     bilat  . Chronic constipation   . Cardiac arrhythmia due to congenital heart disease 2006   Past Surgical History  Procedure Laterality Date  . Inguinal hernia repair Bilateral as an infant    bilateral  . Mouth surgery     Family History  Problem Relation Age of Onset  . Osteoarthritis Father   . Osteoarthritis Mother   . Heart disease Paternal Grandfather   . Alcohol abuse Paternal Grandfather   . Kidney disease Paternal Grandfather   . Colon cancer Neg Hx   . Colon polyps Neg Hx   . Esophageal cancer Neg Hx   . Gallbladder disease Neg Hx   . Cholelithiasis Mother    Social History  Substance Use Topics  . Smoking status: Former Smoker -- 1.50 packs/day for 0 years    Types: Cigarettes    Quit date: 08/05/2005  . Smokeless tobacco: Never Used  . Alcohol Use: 0.0 oz/week    0 Standard drinks or equivalent per week     Comment: Occassionally    Review of Systems  Musculoskeletal: Positive for arthralgias (left foot).  Skin: Negative for color change and pallor.   Neurological: Negative for weakness and numbness.      Allergies  Naproxen  Home Medications   Prior to Admission medications   Medication Sig Start Date End Date Taking? Authorizing Provider  ibuprofen (ADVIL,MOTRIN) 800 MG tablet Take 1 tablet (800 mg total) by mouth 3 (three) times daily. 11/04/15   Gwenneth Whiteman C Miyako Oelke, PA-C  Multiple Vitamin (MULTIVITAMIN) tablet Take 1 tablet by mouth daily.    Historical Provider, MD  oxyCODONE-acetaminophen (PERCOCET/ROXICET) 5-325 MG tablet Take 1-2 tablets by mouth every 4 (four) hours as needed for severe pain. 11/04/15   Ramadan Couey C Caiden Monsivais, PA-C   BP 145/84 mmHg  Pulse 66  Temp(Src) 98.6 F (37 C) (Oral)  Resp 18  SpO2 100% Physical Exam  Constitutional: He appears well-developed and well-nourished. No distress.  HENT:  Head: Normocephalic and atraumatic.  Eyes: Conjunctivae are normal.  Cardiovascular: Normal rate and regular rhythm.   Pulmonary/Chest: Effort normal.  Musculoskeletal: He exhibits tenderness.  Tenderness to left lateral foot, lateral malleolus, and arch of the foot. No deformity, swelling, or erythema.  Neurological: He is alert.  Strength is 5 out of 5. No sensory deficits.  Skin: Skin is warm and dry. He is not diaphoretic.  Psychiatric: He has a normal mood and affect. His behavior is normal.  Nursing note and vitals reviewed.   ED Course  Procedures (including critical care  time) Labs Review Labs Reviewed - No data to display  Imaging Review Dg Foot Complete Left  11/04/2015  CLINICAL DATA:  Lateral foot pain and swelling tonight.  No injury. EXAM: LEFT FOOT - COMPLETE 3+ VIEW COMPARISON:  None. FINDINGS: There is no evidence of fracture or dislocation. Type 3 navicular bone. There is no evidence of arthropathy or other focal bone abnormality. Soft tissues are unremarkable. IMPRESSION: Negative. Electronically Signed   By: Elon Alas M.D.   On: 11/04/2015 01:03   I have personally reviewed and evaluated these images  as part of my medical decision-making.   EKG Interpretation None      MDM   Final diagnoses:  Left foot pain    William Prince presents with left foot and ankle pain that began earlier this evening.  I suspect that the source of the patient's pain is possibly plantar fasciitis. X-ray shows no acute abnormalities. Patient placed in a postop shoe and given instructions for NSAID and ice therapy. Patient to follow up with PCP in 3 days should symptoms continue. Return precautions discussed. Patient voiced understanding of these instructions and is comfortable with discharge.    Lorayne Bender, PA-C 11/04/15 0110  Ripley Fraise, MD 11/04/15 [phone]

## 2015-11-04 NOTE — ED Notes (Signed)
Patient transported to X-ray 

## 2015-11-04 NOTE — ED Notes (Signed)
Pt. injured his left foot while moving furniture this evening , denies fall/ambulatory , reports pain with mild swelling .

## 2015-11-04 NOTE — ED Notes (Signed)
Patient declined crutches. Reports he has crutches at home, and he can only use one under his R arm when needed.

## 2015-12-07 ENCOUNTER — Ambulatory Visit (INDEPENDENT_AMBULATORY_CARE_PROVIDER_SITE_OTHER): Payer: 59 | Admitting: Family Medicine

## 2015-12-07 ENCOUNTER — Other Ambulatory Visit: Payer: Self-pay | Admitting: Family Medicine

## 2015-12-07 ENCOUNTER — Encounter: Payer: Self-pay | Admitting: Family Medicine

## 2015-12-07 VITALS — BP 109/72 | HR 79 | Temp 97.9°F | Resp 16 | Ht 68.5 in | Wt 145.0 lb

## 2015-12-07 DIAGNOSIS — R590 Localized enlarged lymph nodes: Secondary | ICD-10-CM

## 2015-12-07 DIAGNOSIS — R599 Enlarged lymph nodes, unspecified: Secondary | ICD-10-CM | POA: Diagnosis not present

## 2015-12-07 DIAGNOSIS — R634 Abnormal weight loss: Secondary | ICD-10-CM | POA: Diagnosis not present

## 2015-12-07 DIAGNOSIS — R1084 Generalized abdominal pain: Secondary | ICD-10-CM | POA: Diagnosis not present

## 2015-12-07 DIAGNOSIS — G8929 Other chronic pain: Secondary | ICD-10-CM

## 2015-12-07 LAB — COMPREHENSIVE METABOLIC PANEL
ALK PHOS: 48 U/L (ref 39–117)
ALT: 15 U/L (ref 0–53)
AST: 13 U/L (ref 0–37)
Albumin: 4.6 g/dL (ref 3.5–5.2)
BILIRUBIN TOTAL: 0.4 mg/dL (ref 0.2–1.2)
BUN: 20 mg/dL (ref 6–23)
CO2: 30 mEq/L (ref 19–32)
CREATININE: 0.83 mg/dL (ref 0.40–1.50)
Calcium: 9.6 mg/dL (ref 8.4–10.5)
Chloride: 104 mEq/L (ref 96–112)
GFR: 112.97 mL/min (ref >60.00)
Glucose, Bld: 95 mg/dL (ref 70–99)
POTASSIUM: 4.3 meq/L (ref 3.5–5.1)
Sodium: 140 mEq/L (ref 135–145)
Total Protein: 6.9 g/dL (ref 6.0–8.3)

## 2015-12-07 LAB — CBC WITH DIFFERENTIAL/PLATELET
BASOS ABS: 0 {cells}/uL (ref 0–200)
Basophils Relative: 0 %
EOS ABS: 355 {cells}/uL (ref 15–500)
Eosinophils Relative: 5 %
HEMATOCRIT: 41.6 % (ref 38.5–50.0)
Hemoglobin: 13.9 g/dL (ref 13.2–17.1)
LYMPHS PCT: 30 %
Lymphs Abs: 2130 cells/uL (ref 850–3900)
MCH: 30.7 pg (ref 27.0–33.0)
MCHC: 33.4 g/dL (ref 32.0–36.0)
MCV: 91.8 fL (ref 80.0–100.0)
MONOS PCT: 11 %
MPV: 10.8 fL (ref 7.5–12.5)
Monocytes Absolute: 781 cells/uL (ref 200–950)
NEUTROS PCT: 54 %
Neutro Abs: 3834 cells/uL (ref 1500–7800)
PLATELETS: 154 10*3/uL (ref 140–400)
RBC: 4.53 MIL/uL (ref 4.20–5.80)
RDW: 13.5 % (ref 11.0–15.0)
WBC: 7.1 10*3/uL (ref 3.8–10.8)

## 2015-12-07 LAB — LIPASE: Lipase: 153 U/L — ABNORMAL HIGH (ref 11.0–59.0)

## 2015-12-07 LAB — SEDIMENTATION RATE: SED RATE: 6 mm/h (ref 0–22)

## 2015-12-07 LAB — TSH: TSH: 0.82 u[IU]/mL (ref 0.35–4.50)

## 2015-12-07 NOTE — Progress Notes (Signed)
Pre visit review using our clinic review tool, if applicable. No additional management support is needed unless otherwise documented below in the visit note. 

## 2015-12-07 NOTE — Progress Notes (Signed)
OFFICE VISIT  12/07/2015   CC:  Chief Complaint  Patient presents with  . Abdominal Pain  . Weight Loss    10-15lb over last 2 months   HPI:    Patient is a 35 y.o. Caucasian male who presents for ongoing abdominal pain and recurrent wt fluctuations. Most recent wt loss has been about 15 lbs over the last 2-3 months.  Has no appetite lately.   Describes a constant sore, "annoying" pain in mid abdomen, feels a lot of gas.  BMs are thin and flat and long, light brown. Has BM q1-2 days.  Bm not painful.  After he has a BM his abd feels a bit like it has less pressure but he doesn't feel overwhelmingly better. No n/v.  No fevers.  No blood or mucous in stool.  No joint problems, no rashes.  Feels chronically fatigued. Admits to feeling depressed in the mornings.  Says once he gets up an gets going he feels ok, but if he stops then he feels the fatigue strongly again.  No SI or HI.  He associates his depressed mood with feeling fatigued and stomach pain so much.  We did a CT abd/pelv back in 04/2014 for these same sx's and it was normal.  See PMH section below for additional GI history. No recurrent infection problems.  Past Medical History  Diagnosis Date  . Chronic abdominal pain   . 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 Congenital inguinal hernia     bilat  . Chronic constipation   . Cardiac arrhythmia due to congenital heart disease 2006    Past Surgical History  Procedure Laterality Date  . Inguinal hernia repair Bilateral as an infant    bilateral  . Mouth surgery    . Esophagogastroduodenoscopy  09/26/14    mild chronic inactive gastritis; H pylori neg  . Colonoscopy  09/26/14    1 sessile serrated polyp-recall 5 yrsowt    Outpatient Prescriptions Prior to Visit  Medication Sig Dispense Refill  . Multiple Vitamin (MULTIVITAMIN) tablet Take 1 tablet by mouth daily.    Marland Kitchen ibuprofen (ADVIL,MOTRIN) 800 MG tablet Take 1 tablet (800 mg total) by  mouth 3 (three) times daily. (Patient not taking: Reported on 12/07/2015) 21 tablet 0  . oxyCODONE-acetaminophen (PERCOCET/ROXICET) 5-325 MG tablet Take 1-2 tablets by mouth every 4 (four) hours as needed for severe pain. (Patient not taking: Reported on 12/07/2015) 6 tablet 0   No facility-administered medications prior to visit.    Allergies  Allergen Reactions  . Naproxen     Patient had burning pain in stomach, had to have his stomach pumped.    ROS As per HPI  PE: Blood pressure 109/72, pulse 79, temperature 97.9 F (36.6 C), temperature source Oral, resp. rate 16, height 5' 8.5" (1.74 m), weight 145 lb (65.772 kg), SpO2 99 %. Gen: Alert, well appearing.  Patient is oriented to person, place, time, and situation. GYB:WLSL: no injection, icteris, swelling, or exudate.  EOMI, PERRLA. Mouth: lips without lesion/swelling.  Oral mucosa pink and moist. Oropharynx without erythema, exudate, or swelling.  Neck - No masses or thyromegaly or limitation in range of motion CV: RRR, no m/r/g.   LUNGS: CTA bilat, nonlabored resps, good aeration in all lung fields. ABD: soft, ND, BS normal.  Mild TTP in RUQ>LUQ, with more significant TTP in umbillical area and suprapubic region, with minimal LLQ and RLQ TTP.  No mass, bruit, or HSM. GROIN:  I feel a couple of 2 cm sized nodes along L inguinal ligament area and a couple of smaller ones on R inguinal ligament area. Musculoskeletal: no joint swelling, erythema, warmth, or tenderness.  ROM of all joints intact. EXT: no clubbing, cyanosis, or edema.    LABS:   Lab Results  Component Value Date   TSH 1.12 03/09/2014   Lab Results  Component Value Date   WBC 6.8 08/24/2014   HGB 15.2 08/24/2014   HCT 43.8 08/24/2014   MCV 89.2 08/24/2014   PLT 149.0* 08/24/2014   Lab Results  Component Value Date   CREATININE 1.0 03/09/2014   BUN 13 03/09/2014   NA 138 03/09/2014   K 4.4 03/09/2014   CL 102 03/09/2014   CO2 28 03/09/2014   Lab Results   Component Value Date   ALT 18 03/09/2014   AST 18 03/09/2014   ALKPHOS 45 03/09/2014   BILITOT 1.1 03/09/2014   Lab Results  Component Value Date   ESRSEDRATE 5 03/09/2014   IMPRESSION AND PLAN:  Recurrent fatigue and weight loss, with chronic peri-umbilical abdominal pain. Will check CMET, CBC w/diff, peripheral smear, TSH, ESR, lipase, and do hemoccult cards x 3. Will also check CT abd/pelv to further evaluate, particularly since he has some palpable lymph nodes in inguinal region. We did discuss that if all w/u is unrevealing then we'll get him back to GI for further evaluation and management.  An After Visit Summary was printed and given to the patient.  FOLLOW UP: Return for f/u to be determined based on results of w/u.  Signed:  Crissie Sickles, MD           12/07/2015

## 2015-12-08 LAB — PATHOLOGIST SMEAR REVIEW

## 2015-12-11 ENCOUNTER — Ambulatory Visit (INDEPENDENT_AMBULATORY_CARE_PROVIDER_SITE_OTHER)
Admission: RE | Admit: 2015-12-11 | Discharge: 2015-12-11 | Disposition: A | Discharge status: Home / Self Care | Payer: 59 | Source: Ambulatory Visit | Attending: Family Medicine | Admitting: Family Medicine

## 2015-12-11 ENCOUNTER — Encounter: Payer: Self-pay | Admitting: Family Medicine

## 2015-12-11 DIAGNOSIS — R590 Localized enlarged lymph nodes: Secondary | ICD-10-CM

## 2015-12-11 DIAGNOSIS — G8929 Other chronic pain: Secondary | ICD-10-CM

## 2015-12-11 DIAGNOSIS — R599 Enlarged lymph nodes, unspecified: Secondary | ICD-10-CM | POA: Diagnosis not present

## 2015-12-11 DIAGNOSIS — R634 Abnormal weight loss: Secondary | ICD-10-CM | POA: Diagnosis not present

## 2015-12-11 DIAGNOSIS — R1084 Generalized abdominal pain: Secondary | ICD-10-CM

## 2015-12-11 MED ORDER — IOPAMIDOL (ISOVUE-300) INJECTION 61%
100.0000 mL | Freq: Once | INTRAVENOUS | Status: AC | PRN
  Administered 2015-12-11: 75 mL via INTRAVENOUS

## 2016-07-03 DIAGNOSIS — H52223 Regular astigmatism, bilateral: Secondary | ICD-10-CM | POA: Diagnosis not present

## 2017-04-09 ENCOUNTER — Ambulatory Visit: Payer: Self-pay | Admitting: Family Medicine

## 2017-04-09 ENCOUNTER — Encounter: Payer: Self-pay | Admitting: Family Medicine

## 2017-04-23 DIAGNOSIS — M9902 Segmental and somatic dysfunction of thoracic region: Secondary | ICD-10-CM | POA: Diagnosis not present

## 2017-04-23 DIAGNOSIS — G4489 Other headache syndrome: Secondary | ICD-10-CM | POA: Diagnosis not present

## 2017-04-23 DIAGNOSIS — M7611 Psoas tendinitis, right hip: Secondary | ICD-10-CM | POA: Diagnosis not present

## 2017-04-23 DIAGNOSIS — M9901 Segmental and somatic dysfunction of cervical region: Secondary | ICD-10-CM | POA: Diagnosis not present

## 2017-04-23 DIAGNOSIS — M5408 Panniculitis affecting regions of neck and back, sacral and sacrococcygeal region: Secondary | ICD-10-CM | POA: Diagnosis not present

## 2017-04-25 DIAGNOSIS — G4489 Other headache syndrome: Secondary | ICD-10-CM | POA: Diagnosis not present

## 2017-04-25 DIAGNOSIS — M9902 Segmental and somatic dysfunction of thoracic region: Secondary | ICD-10-CM | POA: Diagnosis not present

## 2017-04-25 DIAGNOSIS — M7611 Psoas tendinitis, right hip: Secondary | ICD-10-CM | POA: Diagnosis not present

## 2017-04-25 DIAGNOSIS — M5408 Panniculitis affecting regions of neck and back, sacral and sacrococcygeal region: Secondary | ICD-10-CM | POA: Diagnosis not present

## 2017-04-25 DIAGNOSIS — M9901 Segmental and somatic dysfunction of cervical region: Secondary | ICD-10-CM | POA: Diagnosis not present

## 2017-04-30 DIAGNOSIS — G4489 Other headache syndrome: Secondary | ICD-10-CM | POA: Diagnosis not present

## 2017-04-30 DIAGNOSIS — M9902 Segmental and somatic dysfunction of thoracic region: Secondary | ICD-10-CM | POA: Diagnosis not present

## 2017-04-30 DIAGNOSIS — M9901 Segmental and somatic dysfunction of cervical region: Secondary | ICD-10-CM | POA: Diagnosis not present

## 2017-04-30 DIAGNOSIS — M5408 Panniculitis affecting regions of neck and back, sacral and sacrococcygeal region: Secondary | ICD-10-CM | POA: Diagnosis not present

## 2017-04-30 DIAGNOSIS — M7611 Psoas tendinitis, right hip: Secondary | ICD-10-CM | POA: Diagnosis not present

## 2018-01-23 ENCOUNTER — Emergency Department (HOSPITAL_BASED_OUTPATIENT_CLINIC_OR_DEPARTMENT_OTHER): Payer: 59

## 2018-01-23 ENCOUNTER — Emergency Department (HOSPITAL_BASED_OUTPATIENT_CLINIC_OR_DEPARTMENT_OTHER)
Admission: EM | Admit: 2018-01-23 | Discharge: 2018-01-23 | Disposition: A | Discharge status: Home / Self Care | Payer: 59 | Attending: Emergency Medicine | Admitting: Emergency Medicine

## 2018-01-23 ENCOUNTER — Encounter (HOSPITAL_BASED_OUTPATIENT_CLINIC_OR_DEPARTMENT_OTHER): Payer: Self-pay

## 2018-01-23 ENCOUNTER — Other Ambulatory Visit: Payer: Self-pay

## 2018-01-23 DIAGNOSIS — Z79899 Other long term (current) drug therapy: Secondary | ICD-10-CM | POA: Diagnosis not present

## 2018-01-23 DIAGNOSIS — F17210 Nicotine dependence, cigarettes, uncomplicated: Secondary | ICD-10-CM | POA: Insufficient documentation

## 2018-01-23 DIAGNOSIS — M25511 Pain in right shoulder: Secondary | ICD-10-CM | POA: Diagnosis not present

## 2018-01-23 MED ORDER — METHOCARBAMOL 500 MG PO TABS: 500.0000 mg | ORAL_TABLET | Freq: Two times a day (BID) | ORAL | 0 refills | Status: DC

## 2018-01-23 MED ORDER — HYDROCODONE-ACETAMINOPHEN 5-325 MG PO TABS: 1.0000 | ORAL_TABLET | Freq: Four times a day (QID) | ORAL | 0 refills | Status: DC | PRN

## 2018-01-23 MED ORDER — HYDROCODONE-ACETAMINOPHEN 5-325 MG PO TABS
1.0000 | ORAL_TABLET | Freq: Once | ORAL | Status: AC
  Administered 2018-01-23: 1 via ORAL
  Filled 2018-01-23: qty 1

## 2018-01-23 MED ORDER — METHOCARBAMOL 500 MG PO TABS
500.0000 mg | ORAL_TABLET | Freq: Once | ORAL | Status: AC
  Administered 2018-01-23: 500 mg via ORAL
  Filled 2018-01-23: qty 1

## 2018-01-23 NOTE — Discharge Instructions (Addendum)
Take it easy, but do not lay around too much as this may make any stiffness worse.  Antiinflammatory medications: Take 600 mg of ibuprofen every 6 hours for the next 3 days. After this time, this medication may be used as needed for pain. Take this medication with food to avoid upset stomach. Tylenol: Should you continue to have additional pain while taking the ibuprofen or naproxen, you may add in tylenol as needed. Your daily total maximum amount of tylenol from all sources should be limited to 4000mg /day for persons without liver problems, or 2000mg /day for those with liver problems. Vicodin: May take Vicodin as needed for severe pain.  Do not drive or perform other dangerous activities while taking the Vicodin.  Please note that each pill of Vicodin contains 325 mg of Tylenol and the above dosage limits apply. Muscle relaxer: Robaxin is a muscle relaxer and may help loosen stiff muscles. Do not take the Robaxin while driving or performing other dangerous activities.  Support: Use the arm sling, as needed, for support and comfort. Exercises: Be sure to perform the attached exercises starting with three times a week and working up to performing them daily. This is an essential part of preventing long term problems.   Follow up with the orthopedic specialist for any further management of this issue.

## 2018-01-23 NOTE — ED Provider Notes (Signed)
[facility] Provider Note   CSN: [id number] Arrival date & time: 01/23/18  2104     History   Chief Complaint Chief Complaint  Patient presents with  . Arm Pain    HPI ISAIA HASSELL is a 37 y.o. male.  HPI   QUINTEL MCCALLA is a 37 y.o. male, presenting to the ED with right shoulder pain beginning last night.  Pain is sharp, 3/10 at rest, moderate to severe with movement, nonradiating.  He has taken ibuprofen without relief.  Denies numbness, weakness, falls/trauma, or any other complaints.   Past Medical History:  Diagnosis Date  . Cardiac arrhythmia due to congenital heart disease 2006  . Chronic abdominal pain    CT abd/pelv 04/2014 and 12/2015 both normal.  . Chronic constipation   . 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 Congenital inguinal hernia    bilat    Patient Active Problem List   Diagnosis Date Noted  . Lower abdominal pain 03/09/2014  . Abnormal weight loss 03/09/2014  . Anxiety and depression 03/09/2014  . Otalgia of right ear 05/17/2013  . Cerumen impaction 05/17/2013  . Chronic generalized abdominal pain 02/22/2012  . Constipation, chronic 02/22/2012    Past Surgical History:  Procedure Laterality Date  . COLONOSCOPY  09/26/14   1 sessile serrated polyp-recall 5 yrsowt  . ESOPHAGOGASTRODUODENOSCOPY  09/26/14   mild chronic inactive gastritis; H pylori neg  . INGUINAL HERNIA REPAIR Bilateral as an infant   bilateral  . MOUTH SURGERY          Home Medications    Prior to Admission medications   Medication Sig Start Date End Date Taking? Authorizing Provider  HYDROcodone-acetaminophen (NORCO/VICODIN) 5-325 MG tablet Take 1 tablet by mouth every 6 (six) hours as needed for severe pain. 01/23/18   Joy, Shawn C, PA-C  methocarbamol (ROBAXIN) 500 MG tablet Take 1 tablet (500 mg total) by mouth 2 (two) times daily. 01/23/18   Joy, Shawn C, PA-C  Multiple Vitamin  (MULTIVITAMIN) tablet Take 1 tablet by mouth daily.    [provider]    Family History Family History  Problem Relation Age of Onset  . Osteoarthritis Father   . Osteoarthritis Mother   . Cholelithiasis Mother   . Heart disease Paternal Grandfather   . Alcohol abuse Paternal Grandfather   . Kidney disease Paternal Grandfather   . Colon cancer Neg Hx   . Colon polyps Neg Hx   . Esophageal cancer Neg Hx   . Gallbladder disease Neg Hx     Social History Social History   Tobacco Use  . Smoking status: Current Some Day Smoker    Packs/day: 1.50    Years: 0.00    Pack years: 0.00    Types: Cigarettes, Cigars  . Smokeless tobacco: Never Used  Substance Use Topics  . Alcohol use: Yes    Alcohol/week: 0.0 oz    Comment: rare  . Drug use: No     Allergies   Naproxen   Review of Systems Review of Systems  Musculoskeletal: Positive for arthralgias. Negative for neck pain.  Neurological: Negative for weakness and numbness.     Physical Exam Updated Vital Signs BP 117/80 (BP Location: Right Arm)   Pulse 84   Temp 98.5 F (36.9 C) (Oral)   Resp 18   Ht 5\' 10"  (1.778 m)   Wt 70.8 kg (156 lb)   SpO2 99%  BMI 22.38 kg/m   Physical Exam  Constitutional: He appears well-developed and well-nourished. No distress.  HENT:  Head: Normocephalic and atraumatic.  Eyes: Conjunctivae are normal.  Neck: Neck supple.  Cardiovascular: Normal rate, regular rhythm and intact distal pulses.  Pulmonary/Chest: Effort normal.  Musculoskeletal: He exhibits tenderness.  Tenderness in the region of the right anterior deltoid.  Increased pain with resisted abduction of the right shoulder.  Range of motion otherwise intact.  No noted swelling, erythema, increased warmth, deformity, or crepitus.  Neurological: He is alert.  Sensation to light touch grossly intact in the right upper extremity. Strength 5/5 in the right triceps/biceps. Some decreased strength due to pain with  resisted abduction of the right shoulder.  Left forearm and hand congenitally absent.  Skin: Skin is warm and dry. Capillary refill takes less than 2 seconds. He is not diaphoretic. No pallor.  Psychiatric: He has a normal mood and affect. His behavior is normal.  Nursing note and vitals reviewed.    ED Treatments / Results  Labs (all labs ordered are listed, but only abnormal results are displayed) Labs Reviewed - No data to display  EKG None  Radiology Dg Shoulder Right  Result Date: 01/23/2018 CLINICAL DATA:  Acute onset of right shoulder pain EXAM: RIGHT SHOULDER - 2+ VIEW COMPARISON:  None. FINDINGS: There is no evidence of fracture or dislocation. There is no evidence of arthropathy or other focal bone abnormality. Soft tissues are unremarkable. IMPRESSION: Negative. Electronically Signed   By: Donavan Foil M.D.   On: 01/23/2018 22:59    Procedures Procedures (including critical care time)  Medications Ordered in ED Medications  methocarbamol (ROBAXIN) tablet 500 mg (500 mg Oral Given 01/23/18 2211)  HYDROcodone-acetaminophen (NORCO/VICODIN) 5-325 MG per tablet 1 tablet (1 tablet Oral Given 01/23/18 2211)     Initial Impression / Assessment and Plan / ED Course  I have reviewed the triage vital signs and the nursing notes.  Pertinent labs & imaging results that were available during my care of the patient were reviewed by me and considered in my medical decision making (see chart for details).  Clinical Course as of Jan 25 124  Fri Jan 23, 2018  2304 Patient states he feels much better.   [SJ]    Clinical Course User Index [SJ] Joy, Shawn C, PA-C    Patient presents with right shoulder pain.  No neurologic deficit.  No acute abnormalities on x-ray.  Orthopedic follow-up.  Final Clinical Impressions(s) / ED Diagnoses   Final diagnoses:  Acute pain of right shoulder    ED Discharge Orders        Ordered    HYDROcodone-acetaminophen (NORCO/VICODIN) 5-325 MG  tablet  Every 6 hours PRN     01/23/18 2310    methocarbamol (ROBAXIN) 500 MG tablet  2 times daily     01/23/18 2310       Lorayne Bender, PA-C 01/24/18 0125    Drenda Freeze, MD 01/24/18 1504

## 2018-01-23 NOTE — ED Triage Notes (Signed)
Pt c/o pain to right UE x today-denies injury-NAD-steady gait

## 2018-01-23 NOTE — ED Notes (Signed)
PMS intact before and after. Pt tolerated well. All questions answered. 

## 2018-04-13 ENCOUNTER — Ambulatory Visit: Payer: 59 | Admitting: Family Medicine

## 2019-03-03 ENCOUNTER — Emergency Department (HOSPITAL_BASED_OUTPATIENT_CLINIC_OR_DEPARTMENT_OTHER)
Admission: EM | Admit: 2019-03-03 | Discharge: 2019-03-03 | Disposition: A | Discharge status: Home / Self Care | Payer: 59 | Attending: Emergency Medicine | Admitting: Emergency Medicine

## 2019-03-03 ENCOUNTER — Encounter (HOSPITAL_BASED_OUTPATIENT_CLINIC_OR_DEPARTMENT_OTHER): Payer: Self-pay

## 2019-03-03 ENCOUNTER — Other Ambulatory Visit: Payer: Self-pay

## 2019-03-03 ENCOUNTER — Emergency Department (HOSPITAL_BASED_OUTPATIENT_CLINIC_OR_DEPARTMENT_OTHER): Payer: 59

## 2019-03-03 DIAGNOSIS — Y998 Other external cause status: Secondary | ICD-10-CM | POA: Diagnosis not present

## 2019-03-03 DIAGNOSIS — F17210 Nicotine dependence, cigarettes, uncomplicated: Secondary | ICD-10-CM | POA: Insufficient documentation

## 2019-03-03 DIAGNOSIS — S4991XA Unspecified injury of right shoulder and upper arm, initial encounter: Secondary | ICD-10-CM | POA: Diagnosis not present

## 2019-03-03 DIAGNOSIS — Y93I9 Activity, other involving external motion: Secondary | ICD-10-CM | POA: Diagnosis not present

## 2019-03-03 DIAGNOSIS — Y9289 Other specified places as the place of occurrence of the external cause: Secondary | ICD-10-CM | POA: Insufficient documentation

## 2019-03-03 DIAGNOSIS — M25511 Pain in right shoulder: Secondary | ICD-10-CM

## 2019-03-03 MED ORDER — KETOROLAC TROMETHAMINE 60 MG/2ML IM SOLN
60.0000 mg | Freq: Once | INTRAMUSCULAR | Status: AC
  Administered 2019-03-03: 60 mg via INTRAMUSCULAR
  Filled 2019-03-03: qty 2

## 2019-03-03 MED ORDER — HYDROCODONE-ACETAMINOPHEN 5-325 MG PO TABS
2.0000 | ORAL_TABLET | Freq: Once | ORAL | Status: AC
  Administered 2019-03-03: 2 via ORAL
  Filled 2019-03-03: qty 2

## 2019-03-03 MED ORDER — METHOCARBAMOL 500 MG PO TABS
500.0000 mg | ORAL_TABLET | Freq: Once | ORAL | Status: AC
  Administered 2019-03-03: 21:00:00 500 mg via ORAL
  Filled 2019-03-03: qty 1

## 2019-03-03 MED ORDER — METHOCARBAMOL 500 MG PO TABS: 500.0000 mg | ORAL_TABLET | Freq: Two times a day (BID) | ORAL | 0 refills | Status: DC

## 2019-03-03 NOTE — Discharge Instructions (Signed)
You can take Tylenol or Ibuprofen as directed for pain. You can alternate Tylenol and Ibuprofen every 4 hours. If you take Tylenol at 1pm, then you can take Ibuprofen at 5pm. Then you can take Tylenol again at 9pm.   Take Robaxin as prescribed. This medication will make you drowsy so do not drive or drink alcohol when taking it.  Follow the RICE (Rest, Ice, Compression, Elevation) protocol as directed.   Wear sling for support and stabilization.   Follow up with the referred orthopedic doctor.   Return to the Emergency Department for any worsening pain, numbness/weakness, discoloration or any other worsening or concerning symptoms.
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
## 2019-03-08 DIAGNOSIS — M542 Cervicalgia: Secondary | ICD-10-CM | POA: Diagnosis not present

## 2019-03-08 DIAGNOSIS — S43101A Unspecified dislocation of right acromioclavicular joint, initial encounter: Secondary | ICD-10-CM | POA: Diagnosis not present

## 2019-03-08 DIAGNOSIS — M25511 Pain in right shoulder: Secondary | ICD-10-CM | POA: Insufficient documentation

## 2019-03-08 MED FILL — MELOXICAM 15 MG TABLET: 15 | 30 days supply | Qty: 30 | Fill #0

## 2019-03-08 MED FILL — METHOCARBAMOL 500 MG TABLET: 500 | 10 days supply | Qty: 20 | Fill #0

## 2019-03-10 DIAGNOSIS — M25511 Pain in right shoulder: Secondary | ICD-10-CM | POA: Diagnosis not present

## 2019-04-26 ENCOUNTER — Encounter: Payer: Self-pay | Admitting: Family Medicine

## 2019-06-11 ENCOUNTER — Telehealth: Payer: 59 | Admitting: Nurse Practitioner

## 2019-06-11 DIAGNOSIS — B9789 Other viral agents as the cause of diseases classified elsewhere: Secondary | ICD-10-CM | POA: Diagnosis not present

## 2019-06-11 DIAGNOSIS — J329 Chronic sinusitis, unspecified: Secondary | ICD-10-CM | POA: Diagnosis not present

## 2019-06-11 MED ORDER — FLUTICASONE PROPIONATE 50 MCG/ACT NA SUSP: 2.0000 | Freq: Every day | NASAL | 6 refills | Status: DC

## 2019-06-11 MED FILL — FLUTICASONE PROP 50 MCG SPR: 50 | 30 days supply | Qty: 16 | Fill #0

## 2019-06-11 NOTE — Progress Notes (Signed)
We are sorry that you are not feeling well.  Here is how we plan to help!  Based on what you have shared with me it looks like you have sinusitis.  Sinusitis is inflammation and infection in the sinus cavities of the head.  Based on your presentation I believe you most likely have Acute Viral Sinusitis.This is an infection most likely caused by a virus. There is not specific treatment for viral sinusitis other than to help you with the symptoms until the infection runs its course.  You may use an oral decongestant such as Mucinex D or if you have glaucoma or high blood pressure use plain Mucinex. Saline nasal spray help and can safely be used as often as needed for congestion, I have prescribed: Fluticasone nasal spray two sprays in each nostril once a day. This will help your ears as well.  Some authorities believe that zinc sprays or the use of Echinacea may shorten the course of your symptoms.  Sinus infections are not as easily transmitted as other respiratory infection, however we still recommend that you avoid close contact with loved ones, especially the very young and elderly.  Remember to wash your hands thoroughly throughout the day as this is the number one way to prevent the spread of infection!  Home Care:  Only take medications as instructed by your medical team.  Do not take these medications with alcohol.  A steam or ultrasonic humidifier can help congestion.  You can place a towel over your head and breathe in the steam from hot water coming from a faucet.  Avoid close contacts especially the very young and the elderly.  Cover your mouth when you cough or sneeze.  Always remember to wash your hands.  Get Help Right Away If:  You develop worsening fever or sinus pain.  You develop a severe head ache or visual changes.  Your symptoms persist after you have completed your treatment plan.  Make sure you  Understand these instructions.  Will watch your condition.  Will  get help right away if you are not doing well or get worse.  Your e-visit answers were reviewed by a board certified advanced clinical practitioner to complete your personal care plan.  Depending on the condition, your plan could have included both over the counter or prescription medications.  If there is a problem please reply  once you have received a response from your provider.  Your safety is important to [place].  If you have drug allergies check your prescription carefully.    You can use MyChart to ask questions about today's visit, request a non-urgent call back, or ask for a work or school excuse for 24 hours related to this e-Visit. If it has been greater than 24 hours you will need to follow up with your provider, or enter a new e-Visit to address those concerns.  You will get an e-mail in the next two days asking about your experience.  I hope that your e-visit has been valuable and will speed your recovery. Thank you for using e-visits.   5-10 minutes spent reviewing and documenting in chart.

## 2019-06-18 ENCOUNTER — Ambulatory Visit: Payer: 59 | Admitting: Family Medicine

## 2019-06-18 ENCOUNTER — Other Ambulatory Visit: Payer: Self-pay
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
## 2019-06-19 ENCOUNTER — Other Ambulatory Visit: Payer: Self-pay

## 2019-06-19 ENCOUNTER — Emergency Department (INDEPENDENT_AMBULATORY_CARE_PROVIDER_SITE_OTHER)
Admission: EM | Admit: 2019-06-19 | Discharge: 2019-06-19 | Disposition: A | Discharge status: Home / Self Care | Payer: 59 | Source: Home / Self Care

## 2019-06-19 ENCOUNTER — Encounter: Payer: Self-pay | Admitting: Emergency Medicine

## 2019-06-19 DIAGNOSIS — H60391 Other infective otitis externa, right ear: Secondary | ICD-10-CM

## 2019-06-19 DIAGNOSIS — H60392 Other infective otitis externa, left ear: Secondary | ICD-10-CM

## 2019-06-19 MED ORDER — NEOMYCIN-POLYMYXIN-HC 3.5-10000-1 OT SUSP
4.0000 [drp] | Freq: Four times a day (QID) | OTIC | 0 refills | Status: AC
Start: 2019-06-19 — End: 2019-06-26

## 2019-06-19 NOTE — Discharge Instructions (Signed)
°  You may continue to use Flonase as well as try sinus rinses to help with fullness sensation. Please use antibiotic ear drops as prescribed. Follow up with family medicine later next week if not improving.
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
## 2019-06-19 NOTE — ED Triage Notes (Signed)
Here with intermit right ear fullness and slight  inbalance x 3 dys ago. Reports feeling like under water. Tried hydrogen/peroxide flush last night and noticed blood on qtip. Denies blurred vision, dizziness.

## 2019-08-27 ENCOUNTER — Encounter: Payer: Self-pay | Admitting: Family Medicine

## 2019-08-30 ENCOUNTER — Ambulatory Visit (INDEPENDENT_AMBULATORY_CARE_PROVIDER_SITE_OTHER): Payer: 59 | Admitting: Family Medicine

## 2019-08-30 ENCOUNTER — Other Ambulatory Visit: Payer: Self-pay

## 2019-08-30 ENCOUNTER — Encounter: Payer: Self-pay | Admitting: Family Medicine

## 2019-08-30 VITALS — Wt 147.0 lb

## 2019-08-30 DIAGNOSIS — N529 Male erectile dysfunction, unspecified: Secondary | ICD-10-CM | POA: Diagnosis not present

## 2019-08-30 DIAGNOSIS — R519 Headache, unspecified: Secondary | ICD-10-CM

## 2019-08-30 DIAGNOSIS — M542 Cervicalgia: Secondary | ICD-10-CM

## 2019-08-30 DIAGNOSIS — R6882 Decreased libido: Secondary | ICD-10-CM | POA: Diagnosis not present

## 2019-08-30 DIAGNOSIS — M2550 Pain in unspecified joint: Secondary | ICD-10-CM | POA: Diagnosis not present

## 2019-08-30 DIAGNOSIS — R5383 Other fatigue: Secondary | ICD-10-CM | POA: Diagnosis not present

## 2019-08-30 DIAGNOSIS — M791 Myalgia, unspecified site: Secondary | ICD-10-CM | POA: Diagnosis not present

## 2019-08-30 DIAGNOSIS — W57XXXA Bitten or stung by nonvenomous insect and other nonvenomous arthropods, initial encounter: Secondary | ICD-10-CM

## 2019-08-30 DIAGNOSIS — R591 Generalized enlarged lymph nodes: Secondary | ICD-10-CM
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
## 2019-09-06 ENCOUNTER — Ambulatory Visit: Payer: 59

## 2019-09-07 ENCOUNTER — Other Ambulatory Visit: Payer: Self-pay

## 2019-09-07 ENCOUNTER — Ambulatory Visit (INDEPENDENT_AMBULATORY_CARE_PROVIDER_SITE_OTHER): Payer: 59

## 2019-09-07 DIAGNOSIS — M791 Myalgia, unspecified site: Secondary | ICD-10-CM

## 2019-09-07 DIAGNOSIS — R6882 Decreased libido: Secondary | ICD-10-CM

## 2019-09-07 DIAGNOSIS — R591 Generalized enlarged lymph nodes: Secondary | ICD-10-CM | POA: Diagnosis not present

## 2019-09-07 DIAGNOSIS — R519 Headache, unspecified: Secondary | ICD-10-CM | POA: Diagnosis not present

## 2019-09-07 DIAGNOSIS — W57XXXA Bitten or stung by nonvenomous insect and other nonvenomous arthropods, initial encounter: Secondary | ICD-10-CM | POA: Diagnosis not present

## 2019-09-07 DIAGNOSIS — R5383 Other fatigue: Secondary | ICD-10-CM

## 2019-09-07 DIAGNOSIS — N529 Male erectile dysfunction, unspecified: Secondary | ICD-10-CM | POA: Diagnosis not present

## 2019-09-07 DIAGNOSIS — M542 Cervicalgia: Secondary | ICD-10-CM

## 2019-09-07 DIAGNOSIS — M2550 Pain in unspecified joint: Secondary | ICD-10-CM | POA: Diagnosis not present

## 2019-09-07 NOTE — Addendum Note (Signed)
Addended by: Deveron Furlong D on: 09/07/2019 03:01 PM   Modules accepted: Orders

## 2019-09-07 NOTE — Addendum Note (Signed)
Addended by: Deveron Furlong D on: 09/07/2019 03:17 PM   Modules accepted: Orders

## 2019-09-07 NOTE — Addendum Note (Signed)
Addended by: Deveron Furlong D on: 09/07/2019 03:00 PM   Modules accepted: Orders

## 2019-09-08 LAB — LYME AB/WESTERN BLOT REFLEX
LYME DISEASE AB, QUANT, IGM: <0.8 index (ref 0.00–0.79)
Lyme IgG/IgM Ab: <0.91 {ISR} (ref 0.00–0.90)

## 2019-09-11 LAB — CBC WITH DIFFERENTIAL/PLATELET
Absolute Monocytes: 719 cells/uL (ref 200–950)
Basophils Absolute: 40 cells/uL (ref 0–200)
Basophils Relative: 0.5 %
Eosinophils Absolute: 521 cells/uL — ABNORMAL HIGH (ref 15–500)
Eosinophils Relative: 6.6 %
HCT: 42.3 % (ref 38.5–50.0)
Hemoglobin: 14.5 g/dL (ref 13.2–17.1)
Lymphs Abs: 2054 cells/uL (ref 850–3900)
MCH: 31.2 pg (ref 27.0–33.0)
MCHC: 34.3 g/dL (ref 32.0–36.0)
MCV: 91 fL (ref 80.0–100.0)
MPV: 11.3 fL (ref 7.5–12.5)
Monocytes Relative: 9.1 %
Neutro Abs: 4566 cells/uL (ref 1500–7800)
Neutrophils Relative %: 57.8 %
Platelets: 130 10*3/uL — ABNORMAL LOW (ref 140–400)
RBC: 4.65 10*6/uL (ref 4.20–5.80)
RDW: 12.7 % (ref 11.0–15.0)
Total Lymphocyte: 26 %
WBC: 7.9 10*3/uL (ref 3.8–10.8)

## 2019-09-11 LAB — COMPREHENSIVE METABOLIC PANEL
AG Ratio: 1.8 (calc) (ref 1.0–2.5)
ALT: 28 U/L (ref 9–46)
AST: 20 U/L (ref 10–40)
Albumin: 4.8 g/dL (ref 3.6–5.1)
Alkaline phosphatase (APISO): 51 U/L (ref 36–130)
BUN: 21 mg/dL (ref 7–25)
CO2: 24 mmol/L (ref 20–32)
Calcium: 9.8 mg/dL (ref 8.6–10.3)
Chloride: 104 mmol/L (ref 98–110)
Creat: 1.16 mg/dL (ref 0.60–1.35)
Globulin: 2.7 g/dL (calc) (ref 1.9–3.7)
Glucose, Bld: 108 mg/dL — ABNORMAL HIGH (ref 65–99)
Potassium: 4.2 mmol/L (ref 3.5–5.3)
Sodium: 141 mmol/L (ref 135–146)
Total Bilirubin: 0.4 mg/dL (ref 0.2–1.2)
Total Protein: 7.5 g/dL (ref 6.1–8.1)

## 2019-09-11 LAB — TESTOSTERONE TOTAL,FREE,BIO, MALES
Albumin: 4.8 g/dL (ref 3.6–5.1)
Sex Hormone Binding: 31 nmol/L (ref 10–50)
Testosterone, Bioavailable: 123.4 ng/dL (ref >=110.0)
Testosterone, Free: 56.4 pg/mL (ref 46.0–224.0)
Testosterone: 416 ng/dL (ref 250–827)

## 2019-09-11 LAB — ANA SCREEN,IFA,REFLEX TITER/PATTERN,REFLEX MPLX 11 AB CASCADE
14-3-3 eta Protein: <0.2 ng/mL (ref <0.2)
Anti Nuclear Antibody (ANA): NEGATIVE
Cyclic Citrullin Peptide Ab: <16 UNITS
Rheumatoid fact SerPl-aCnc: <14 IU/mL (ref <14)

## 2019-09-11 LAB — TSH: TSH: 1.9 mIU/L (ref 0.40–4.50)

## 2019-09-11 LAB — SEDIMENTATION RATE: Sed Rate: 2 mm/h (ref 0–15)

## 2019-09-11 LAB — PARVOVIRUS B19 ANTIBODY, IGG AND IGM
Parvovirus B19 IgG: 5.6 — ABNORMAL HIGH (ref <0.9)
Parvovirus B19 IgM: 0.1 (ref <0.9)

## 2019-09-11 LAB — CK: Total CK: 55 U/L (ref 44–196)

## 2019-09-26 ENCOUNTER — Emergency Department (HOSPITAL_COMMUNITY)
Admission: EM | Admit: 2019-09-26 | Discharge: 2019-09-26 | Disposition: A | Discharge status: Home / Self Care | Payer: 59 | Attending: Emergency Medicine | Admitting: Emergency Medicine

## 2019-09-26 ENCOUNTER — Other Ambulatory Visit: Payer: Self-pay

## 2019-09-26 ENCOUNTER — Emergency Department (HOSPITAL_COMMUNITY): Payer: 59

## 2019-09-26 DIAGNOSIS — F17210 Nicotine dependence, cigarettes, uncomplicated: Secondary | ICD-10-CM | POA: Insufficient documentation

## 2019-09-26 DIAGNOSIS — R002 Palpitations: Secondary | ICD-10-CM | POA: Insufficient documentation

## 2019-09-26 DIAGNOSIS — Z79899 Other long term (current) drug therapy: Secondary | ICD-10-CM | POA: Insufficient documentation

## 2019-09-26 DIAGNOSIS — R000 Tachycardia, unspecified: Secondary | ICD-10-CM | POA: Diagnosis not present

## 2019-09-26 LAB — RAPID URINE DRUG SCREEN, HOSP PERFORMED
Amphetamines: NOT DETECTED
Barbiturates: NOT DETECTED
Benzodiazepines: NOT DETECTED
Cocaine: NOT DETECTED
Opiates: NOT DETECTED
Tetrahydrocannabinol: POSITIVE — AB

## 2019-09-26 LAB — BASIC METABOLIC PANEL
Anion gap: 13 (ref 5–15)
BUN: 14 mg/dL (ref 6–20)
CO2: 25 mmol/L (ref 22–32)
Calcium: 9.4 mg/dL (ref 8.9–10.3)
Chloride: 100 mmol/L (ref 98–111)
Creatinine, Ser: 1.07 mg/dL (ref 0.61–1.24)
GFR calc Af Amer: >60 mL/min (ref >60)
GFR calc non Af Amer: >60 mL/min (ref >60)
Glucose, Bld: 125 mg/dL — ABNORMAL HIGH (ref 70–99)
Potassium: 3.5 mmol/L (ref 3.5–5.1)
Sodium: 138 mmol/L (ref 135–145)

## 2019-09-26 LAB — MAGNESIUM: Magnesium: 1.8 mg/dL (ref 1.7–2.4)

## 2019-09-26 LAB — CBC
HCT: 43.8 % (ref 39.0–52.0)
Hemoglobin: 14.5 g/dL (ref 13.0–17.0)
MCH: 31.5 pg (ref 26.0–34.0)
MCHC: 33.1 g/dL (ref 30.0–36.0)
MCV: 95.2 fL (ref 80.0–100.0)
Platelets: 145 10*3/uL — ABNORMAL LOW (ref 150–400)
RBC: 4.6 MIL/uL (ref 4.22–5.81)
RDW: 12.9 % (ref 11.5–15.5)
WBC: 9.4 10*3/uL (ref 4.0–10.5)
nRBC: 0 % (ref 0.0–0.2)

## 2019-09-26 LAB — TSH: TSH: 1.375 u[IU]/mL (ref 0.350–4.500)

## 2019-09-26 LAB — TROPONIN I (HIGH SENSITIVITY)
Troponin I (High Sensitivity): <2 ng/L (ref <18)
Troponin I (High Sensitivity): <2 ng/L (ref <18)

## 2019-09-26 MED ORDER — SODIUM CHLORIDE 0.9% FLUSH: 3.0000 mL | Freq: Once | INTRAVENOUS | Status: DC

## 2019-09-26 NOTE — ED Triage Notes (Signed)
Twenty mins PTA pt was at rest when he became diaphoretic and had palpitations lasting 10-15 mins. Pt then got up and walked around and became cold and clammy. Pt ambulatory but sts he feels shaky and endorses dizziness. Sts he drank some herbal tea one hour prior to symptoms but that is not the first time he has drank that tea. Says he has been more tired than normal the last few days.

## 2019-09-26 NOTE — ED Notes (Signed)
Pt currently in XRAY 

## 2019-09-26 NOTE — Discharge Instructions (Addendum)
Return for new or worsening symptoms.  Follow-up with local clinician to discuss arranging heart monitor.  Stay well-hydrated.  Avoid significant herbal teas or excessive caffeine until you see a clinician.
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
## 2019-09-26 NOTE — ED Notes (Signed)
Per EDP, Pt is appropriate for discharge while second Troponin is still pending.

## 2019-09-26 NOTE — ED Notes (Signed)
Patient verbalizes understanding of discharge instructions. Opportunity for questioning and answers were provided. Armband removed by staff, pt discharged from ED ambulatory to home.  

## 2019-10-20 ENCOUNTER — Ambulatory Visit: Payer: 59 | Admitting: Family Medicine

## 2019-10-20 ENCOUNTER — Telehealth: Payer: Self-pay

## 2019-10-20 DIAGNOSIS — Z0289 Encounter for other administrative examinations: Secondary | ICD-10-CM

## 2019-10-20 NOTE — Telephone Encounter (Signed)
Contacted patient regarding appt twice, during the first attempt a message was left and during the second attempt, forwarded to voicemail.

## 2019-11-17 ENCOUNTER — Other Ambulatory Visit: Payer: Self-pay

## 2019-11-17 ENCOUNTER — Emergency Department (HOSPITAL_BASED_OUTPATIENT_CLINIC_OR_DEPARTMENT_OTHER): Payer: 59

## 2019-11-17 ENCOUNTER — Emergency Department (HOSPITAL_BASED_OUTPATIENT_CLINIC_OR_DEPARTMENT_OTHER)
Admission: EM | Admit: 2019-11-17 | Discharge: 2019-11-17 | Disposition: A | Discharge status: Home / Self Care | Payer: 59 | Attending: Emergency Medicine | Admitting: Emergency Medicine

## 2019-11-17 ENCOUNTER — Encounter (HOSPITAL_BASED_OUTPATIENT_CLINIC_OR_DEPARTMENT_OTHER): Payer: Self-pay | Admitting: Emergency Medicine

## 2019-11-17 DIAGNOSIS — W228XXA Striking against or struck by other objects, initial encounter: Secondary | ICD-10-CM | POA: Insufficient documentation

## 2019-11-17 DIAGNOSIS — Y929 Unspecified place or not applicable: Secondary | ICD-10-CM | POA: Diagnosis not present

## 2019-11-17 DIAGNOSIS — R238 Other skin changes: Secondary | ICD-10-CM | POA: Diagnosis not present

## 2019-11-17 DIAGNOSIS — Y999 Unspecified external cause status: Secondary | ICD-10-CM | POA: Insufficient documentation

## 2019-11-17 DIAGNOSIS — Y9389 Activity, other specified: Secondary | ICD-10-CM | POA: Insufficient documentation

## 2019-11-17 DIAGNOSIS — S90111A Contusion of right great toe without damage to nail, initial encounter: Secondary | ICD-10-CM | POA: Insufficient documentation

## 2019-11-17 DIAGNOSIS — M79674 Pain in right toe(s): Secondary | ICD-10-CM

## 2019-11-17 DIAGNOSIS — S99921A Unspecified injury of right foot, initial encounter: Secondary | ICD-10-CM | POA: Diagnosis not present

## 2019-11-17 DIAGNOSIS — F17210 Nicotine dependence, cigarettes, uncomplicated: Secondary | ICD-10-CM | POA: Insufficient documentation

## 2019-11-17 DIAGNOSIS — M79604 Pain in right leg: Secondary | ICD-10-CM | POA: Diagnosis not present

## 2019-11-17 DIAGNOSIS — R58 Hemorrhage, not elsewhere classified: Secondary | ICD-10-CM

## 2019-11-17 MED ORDER — ACETAMINOPHEN 500 MG PO TABS
1000.0000 mg | ORAL_TABLET | Freq: Once | ORAL | Status: AC
  Administered 2019-11-17: 1000 mg via ORAL
  Filled 2019-11-17: qty 2

## 2019-11-17 MED ORDER — IBUPROFEN 800 MG PO TABS
800.0000 mg | ORAL_TABLET | Freq: Once | ORAL | Status: AC
  Administered 2019-11-17: 800 mg via ORAL
  Filled 2019-11-17: qty 1

## 2019-11-17 MED ORDER — DICLOFENAC SODIUM 1 % EX GEL: 4.0000 g | Freq: Four times a day (QID) | CUTANEOUS | 0 refills | Status: AC

## 2019-11-17 MED FILL — DICLOFENAC SODIUM 1 % GEL: 1 | 6 days supply | Qty: 100 | Fill #0
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
## 2019-11-17 NOTE — ED Triage Notes (Signed)
Ptc/o right great toe injury while kicking a ball

## 2020-01-25 ENCOUNTER — Other Ambulatory Visit: Payer: Self-pay

## 2020-01-25 ENCOUNTER — Emergency Department (INDEPENDENT_AMBULATORY_CARE_PROVIDER_SITE_OTHER)
Admission: RE | Admit: 2020-01-25 | Discharge: 2020-01-25 | Disposition: A | Discharge status: Home / Self Care | Payer: 59 | Source: Ambulatory Visit | Attending: Family Medicine | Admitting: Family Medicine

## 2020-01-25 VITALS — BP 122/71 | HR 90 | Temp 98.4°F | Resp 18

## 2020-01-25 DIAGNOSIS — M26621 Arthralgia of right temporomandibular joint: Secondary | ICD-10-CM

## 2020-01-25 DIAGNOSIS — H6981 Other specified disorders of Eustachian tube, right ear: Secondary | ICD-10-CM | POA: Diagnosis not present

## 2020-01-25 MED ORDER — AMOXICILLIN 875 MG PO TABS: 875.0000 mg | ORAL_TABLET | Freq: Two times a day (BID) | ORAL | 0 refills | Status: AC

## 2020-01-25 MED ORDER — PREDNISONE 20 MG PO TABS: ORAL_TABLET | ORAL | 0 refills | Status: AC

## 2020-01-25 MED FILL — AMOXICILLIN 875 MG TABS: 875 | 7 days supply | Qty: 14 | Fill #0

## 2020-01-25 MED FILL — predniSONE 20 MG TABS: 20 | 5 days supply | Qty: 12 | Fill #0
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
## 2020-01-25 NOTE — Discharge Instructions (Addendum)
May take Pseudoephedrine (30mg , one or two every 4 to 6 hours) for sinus congestion.     May use Afrin nasal spray (or generic oxymetazoline) each morning for about 5 days and then discontinue.  Also recommend using saline nasal spray several times daily and saline nasal irrigation (AYR is a common brand).  Use Flonase nasal spray each morning after using Afrin nasal spray and saline nasal irrigation.  Put ice on the painful right TMJ: Put ice in a plastic bag. Place a towel between your skin and the bag. Leave the ice on for 20 minutes, 2-3 times a day.

## 2020-01-25 NOTE — ED Triage Notes (Signed)
Pt c/o ear pain x 2 weeks. Was hearing a crackling sound and ringing, but now just painful. Pain 4/10 Hydrogen flushes at home.

## 2020-02-28 ENCOUNTER — Telehealth: Payer: 59 | Admitting: Family

## 2020-02-28 DIAGNOSIS — M5442 Lumbago with sciatica, left side: Secondary | ICD-10-CM | POA: Diagnosis not present

## 2020-02-28 MED ORDER — BACLOFEN 10 MG PO TABS: 10.0000 mg | ORAL_TABLET | Freq: Three times a day (TID) | ORAL | 0 refills | Status: AC

## 2020-02-28 MED FILL — BACLOFEN 10 MG TABS: 10 | 10 days supply | Qty: 30 | Fill #0

## 2020-02-28 NOTE — Progress Notes (Signed)

## 2020-03-02 ENCOUNTER — Ambulatory Visit: Payer: 59 | Admitting: Family Medicine

## 2020-03-06 ENCOUNTER — Ambulatory Visit: Payer: 59 | Admitting: Family Medicine
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
## 2020-03-13 ENCOUNTER — Other Ambulatory Visit: Payer: Self-pay

## 2020-03-13 ENCOUNTER — Encounter: Payer: Self-pay | Admitting: Family Medicine

## 2020-03-13 ENCOUNTER — Ambulatory Visit: Payer: 59 | Admitting: Family Medicine

## 2020-03-13 ENCOUNTER — Ambulatory Visit (INDEPENDENT_AMBULATORY_CARE_PROVIDER_SITE_OTHER): Payer: 59

## 2020-03-13 VITALS — BP 119/75 | HR 66 | Temp 98.1°F | Resp 18 | Ht 69.0 in | Wt 144.6 lb

## 2020-03-13 DIAGNOSIS — M7918 Myalgia, other site: Secondary | ICD-10-CM

## 2020-03-13 DIAGNOSIS — M533 Sacrococcygeal disorders, not elsewhere classified: Secondary | ICD-10-CM

## 2020-03-13 DIAGNOSIS — M545 Low back pain, unspecified: Secondary | ICD-10-CM

## 2020-03-13 DIAGNOSIS — M47816 Spondylosis without myelopathy or radiculopathy, lumbar region: Secondary | ICD-10-CM | POA: Diagnosis not present

## 2020-03-13 DIAGNOSIS — M25552 Pain in left hip: Secondary | ICD-10-CM | POA: Diagnosis not present

## 2020-03-13 DIAGNOSIS — M25551 Pain in right hip: Secondary | ICD-10-CM | POA: Diagnosis not present
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
## 2020-03-13 NOTE — Patient Instructions (Signed)
Take 1000 mg tylenol three times a day as needed for pain

## 2020-03-14 ENCOUNTER — Encounter: Payer: Self-pay | Admitting: Family Medicine

## 2020-03-21 ENCOUNTER — Ambulatory Visit: Payer: 59 | Admitting: Physical Therapy

## 2020-10-28 IMAGING — CR DG CHEST 2V
2 series · 2 of 2 positions shown · non-contrast
Comparison: None.

CLINICAL DATA: Palpitations

EXAM:
CHEST - 2 VIEW

[chest pa]
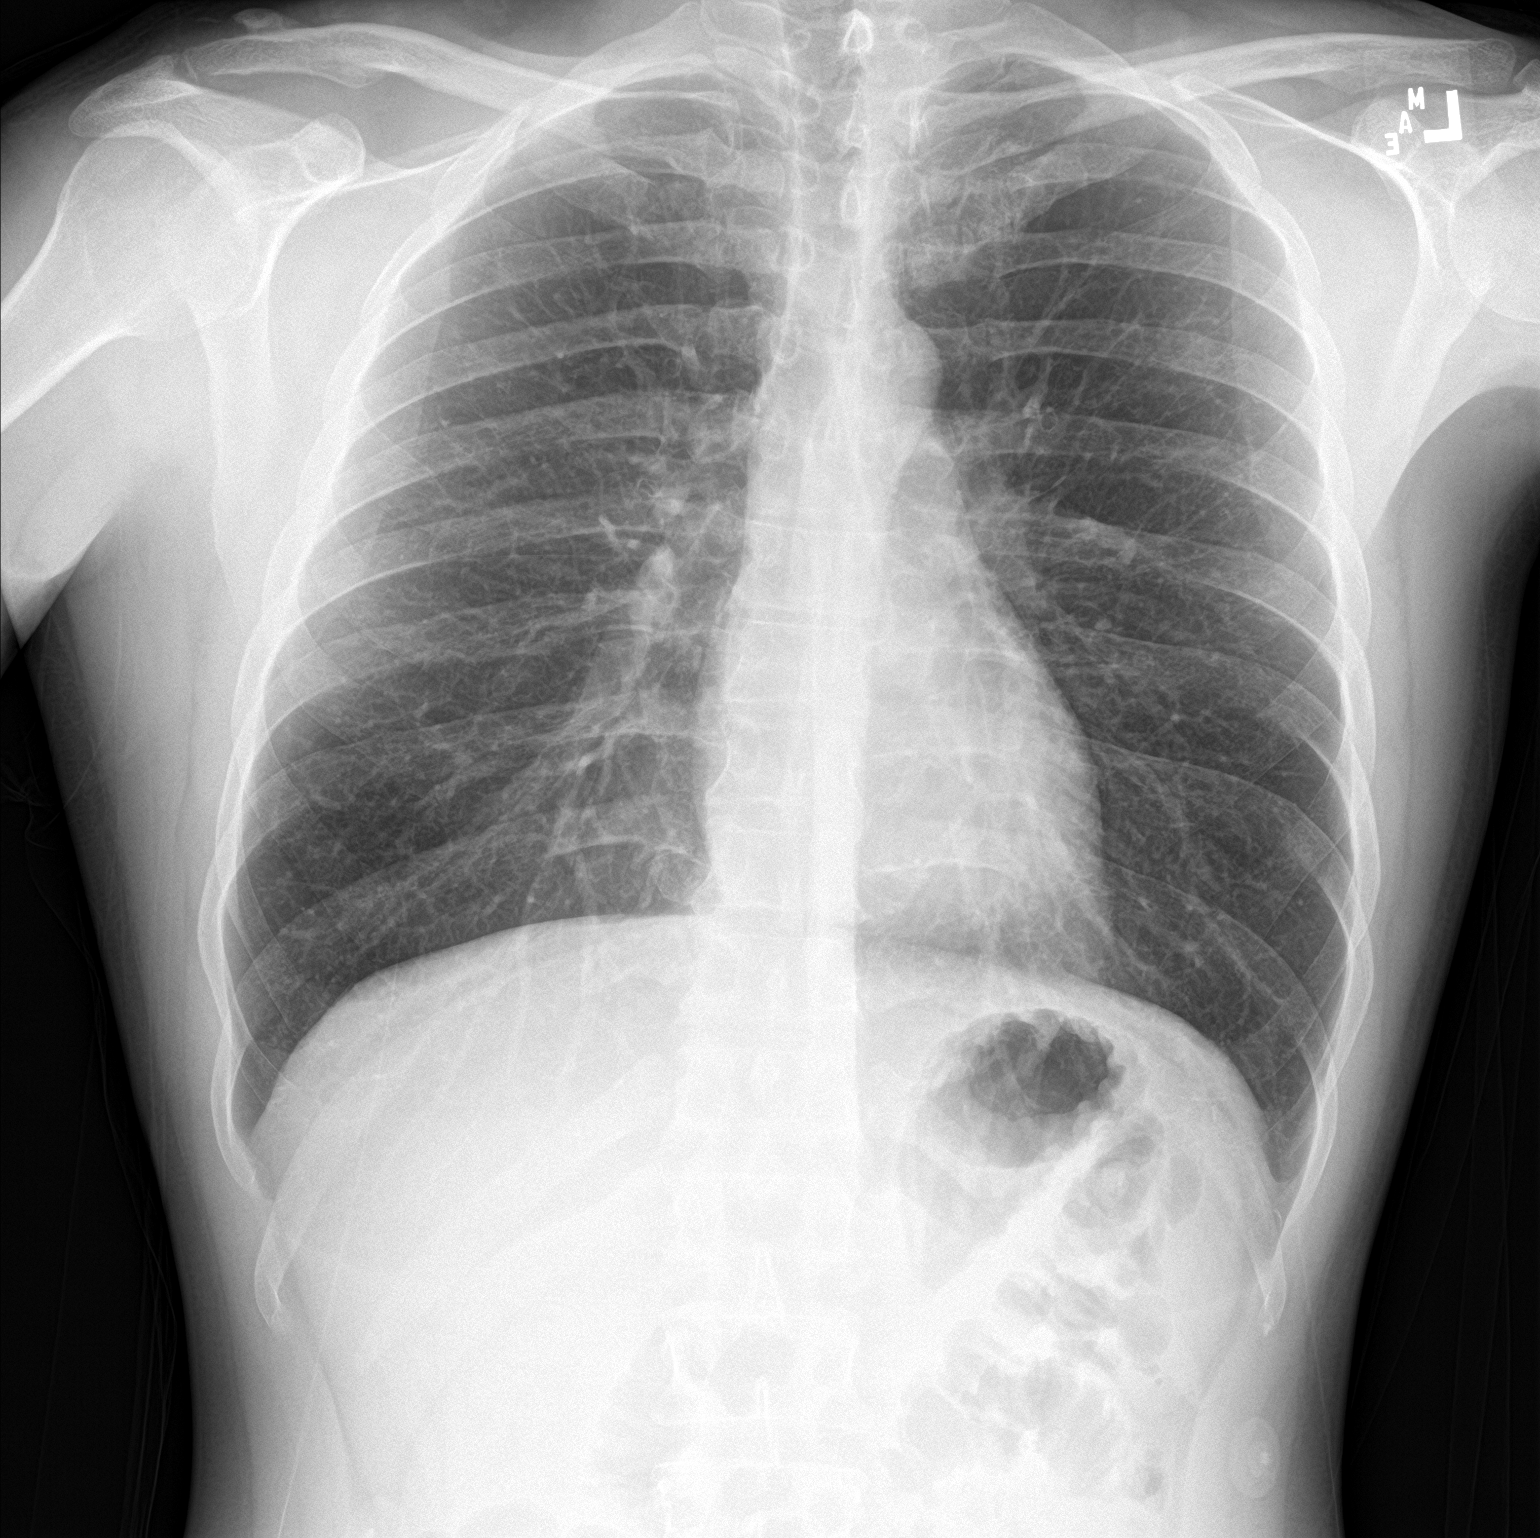

[chest lat]
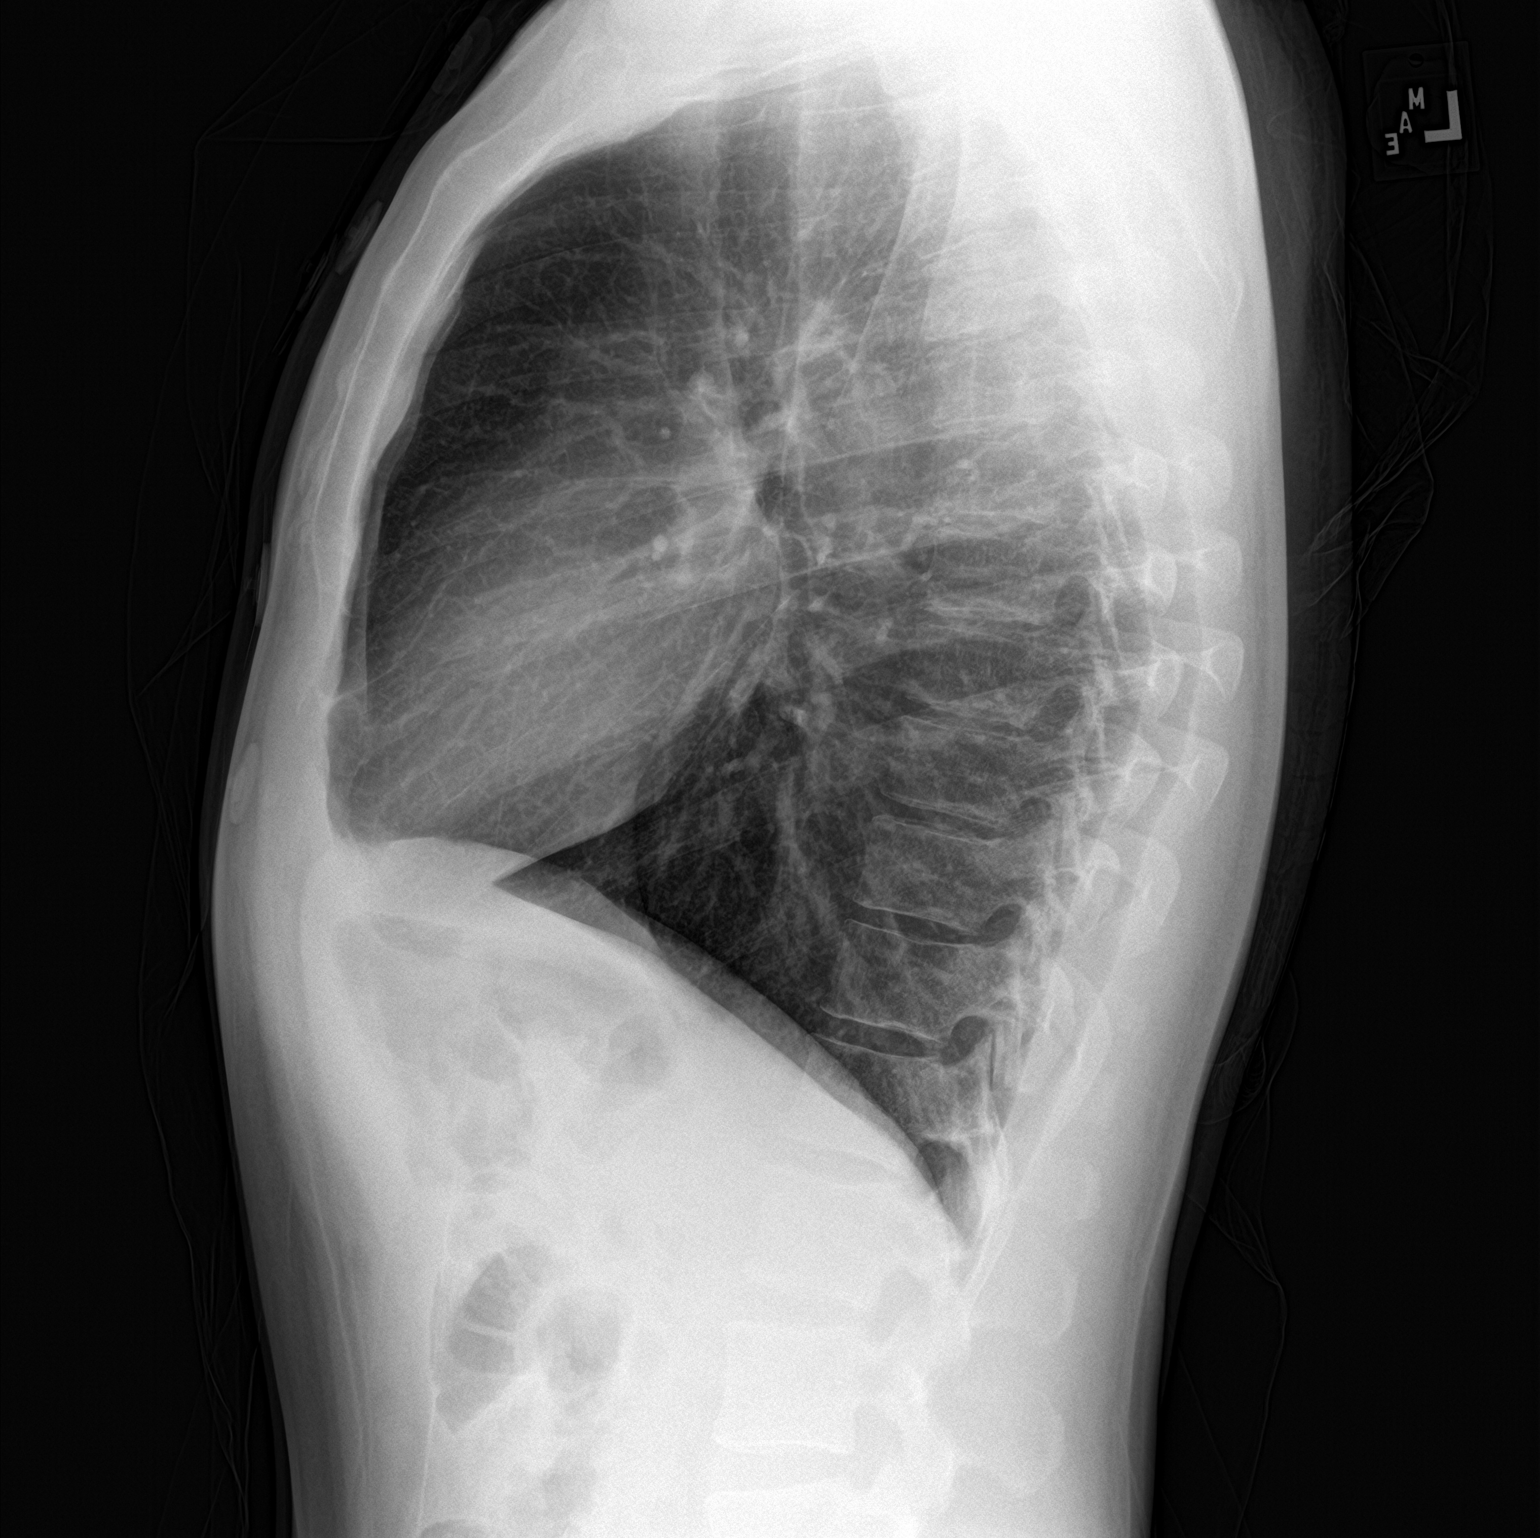

[2 of 2 positions shown; findings below may reference images not displayed]

FINDINGS: The heart size and mediastinal contours are within normal limits.
Both lungs are clear. The visualized skeletal structures are
unremarkable.
IMPRESSION: No active cardiopulmonary disease.

## 2021-04-15 IMAGING — DX DG LUMBAR SPINE COMPLETE 4+V
5 series · 5 of 5 positions shown · non-contrast
Comparison: CT dated December 11, 2015

CLINICAL DATA: Pain

EXAM:
LUMBAR SPINE - COMPLETE 4+ VIEW

[l-spine ap]
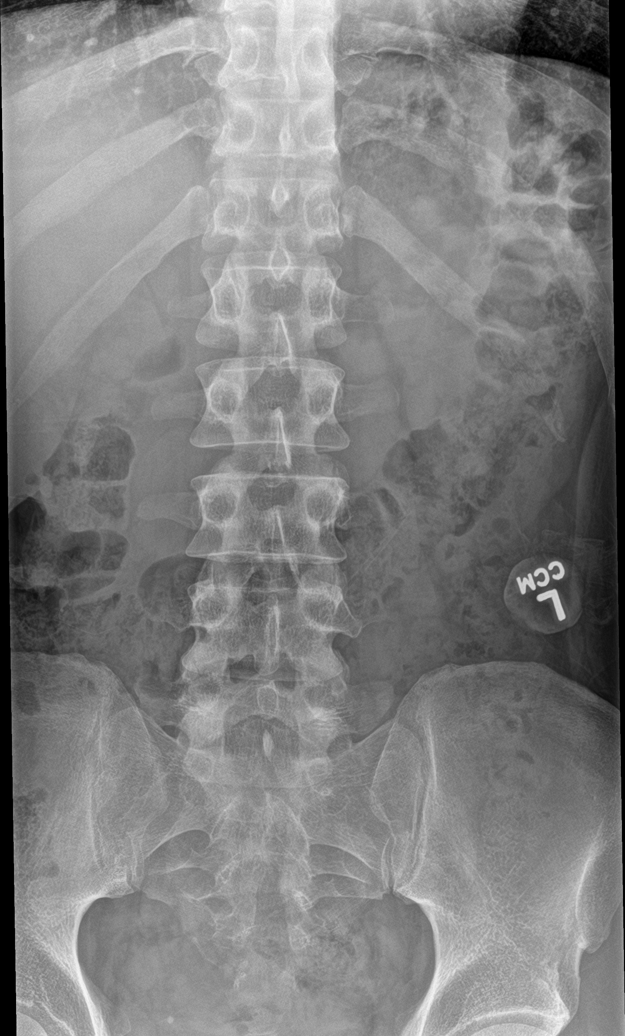

[l-spine obl (1 of 2)]
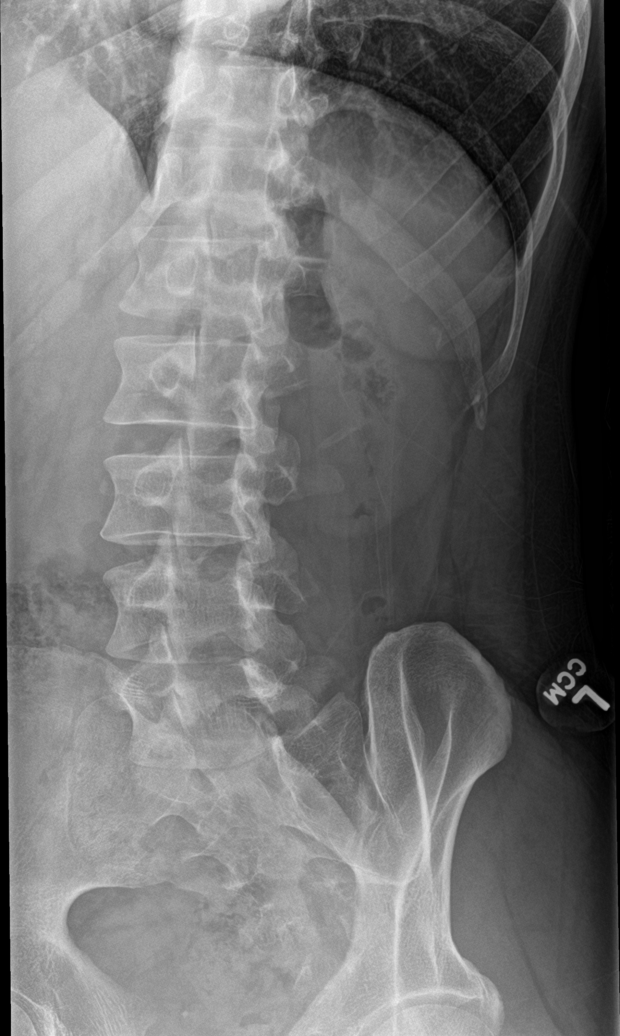

[l-spine obl (2 of 2)]
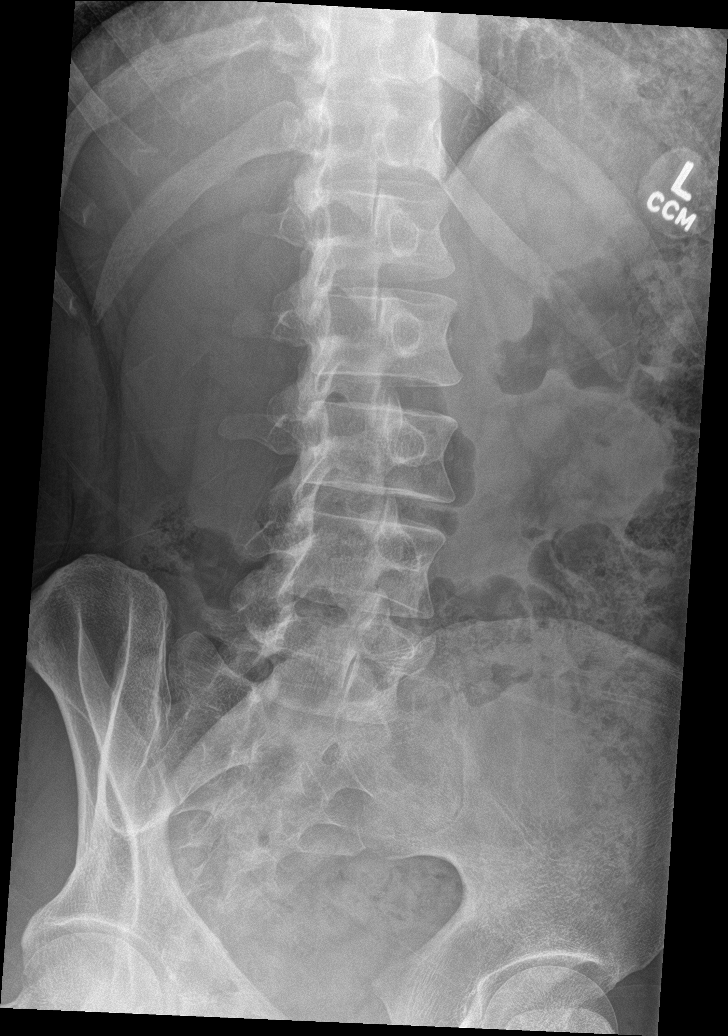

[l-spine lat]
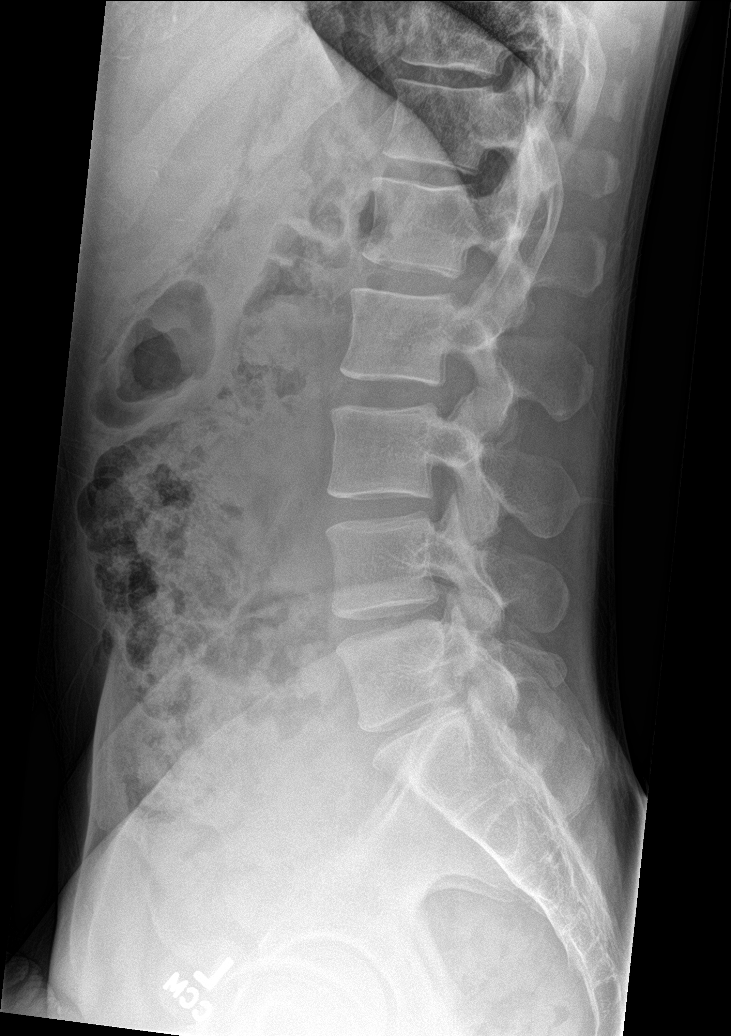

[l-spine spot]
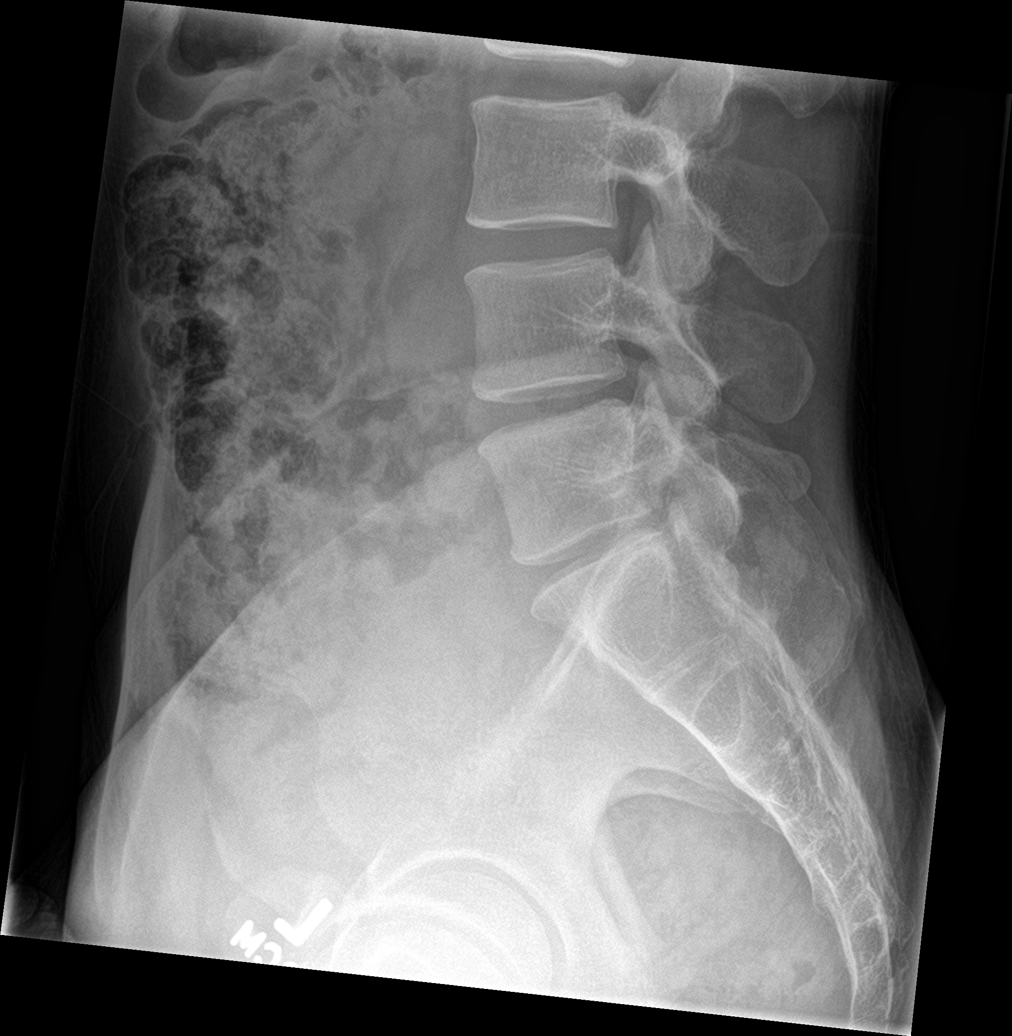

[5 of 5 positions shown; findings below may reference images not displayed]

FINDINGS: There is no acute compression fracture. There is mild disc height
loss at the L4-L5 and L5-S1 levels. There is mild facet arthrosis in
the lower lumbar segments.
IMPRESSION: Mild degenerative changes of the lower lumbar spine. No acute
compression fracture.

## 2021-04-15 IMAGING — DX DG HIP (WITH OR WITHOUT PELVIS) 3-4V BILAT
5 series · 5 of 5 positions shown · non-contrast
Comparison: None.

CLINICAL DATA: Pain

EXAM:
DG HIP (WITH OR WITHOUT PELVIS) 3-4V BILAT

[pelvis ap]
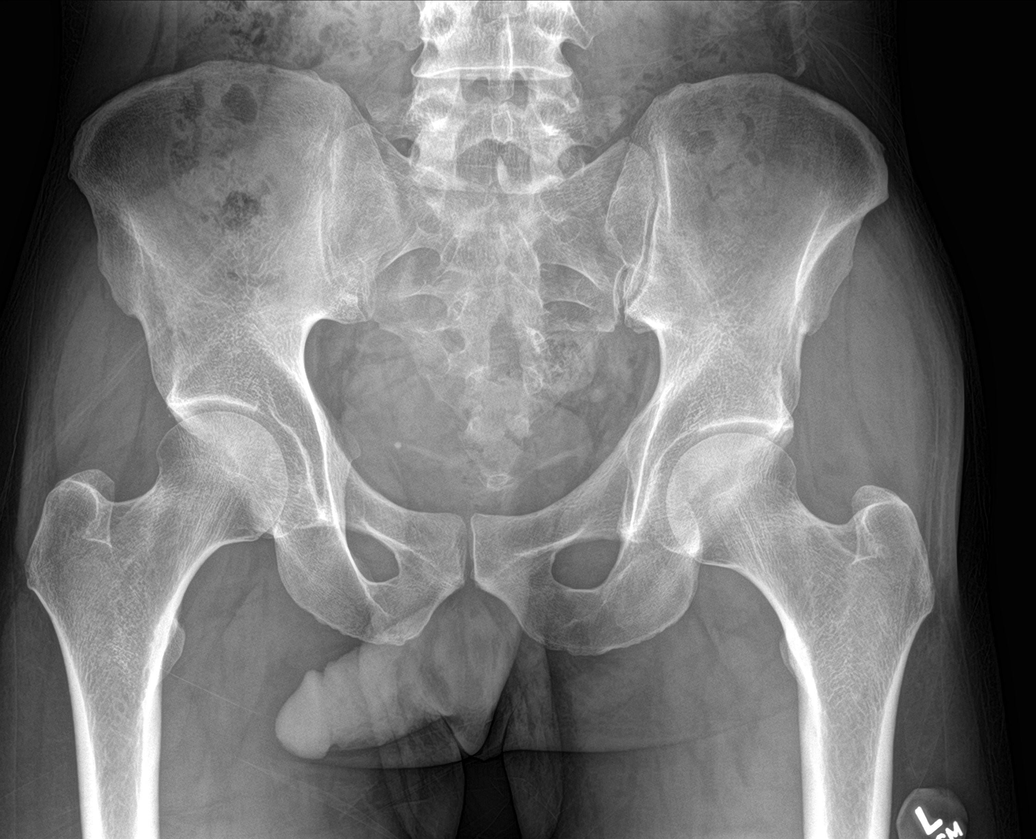

[hip ap (1 of 2)]
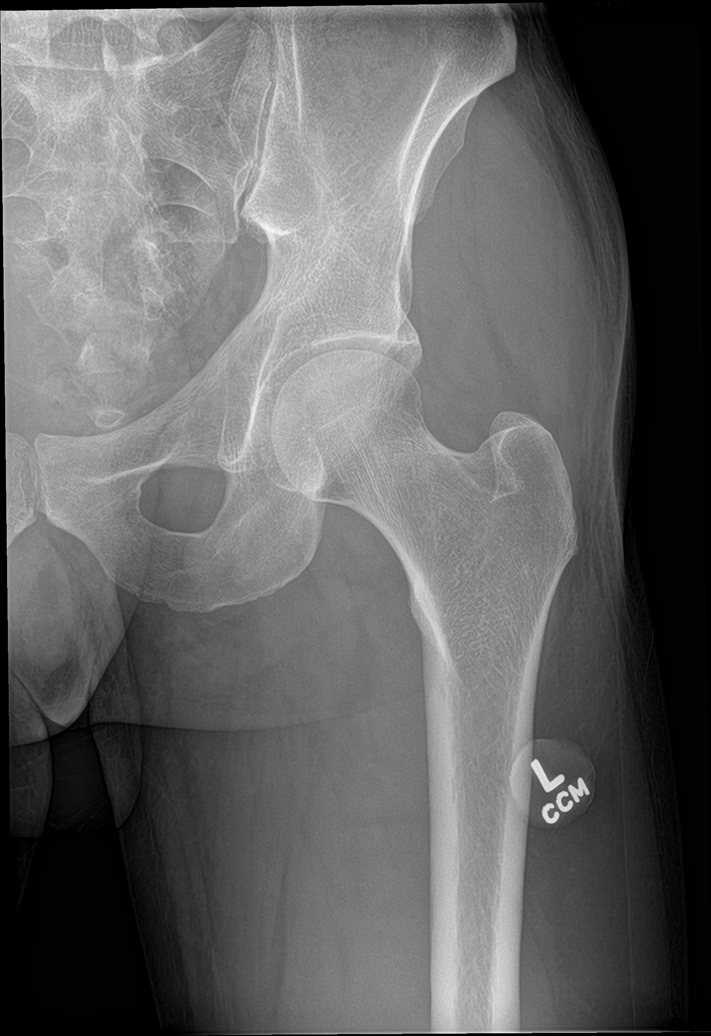

[hip ap (2 of 2)]
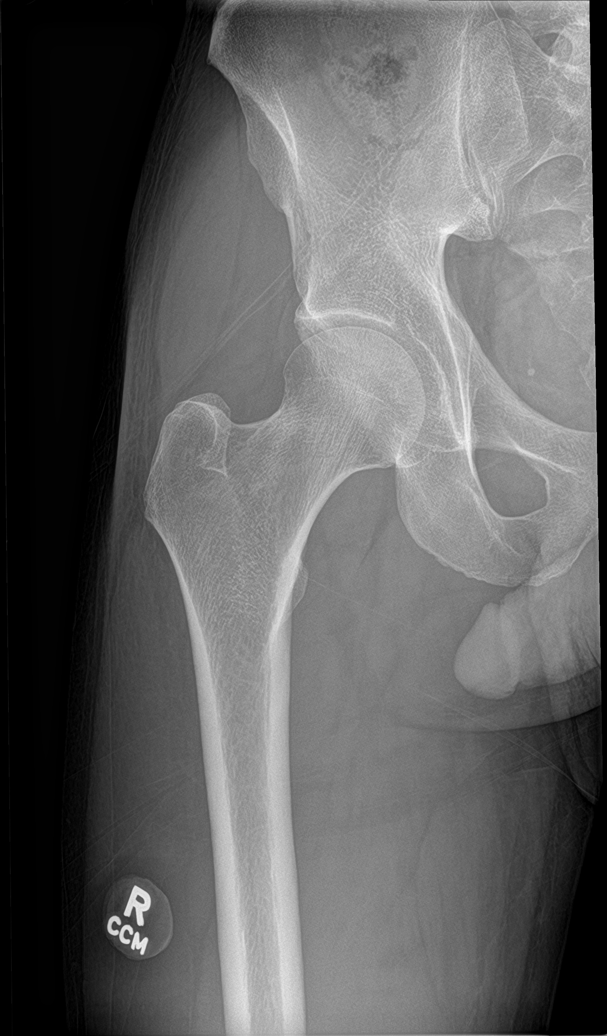

[hip lat (1 of 2)]
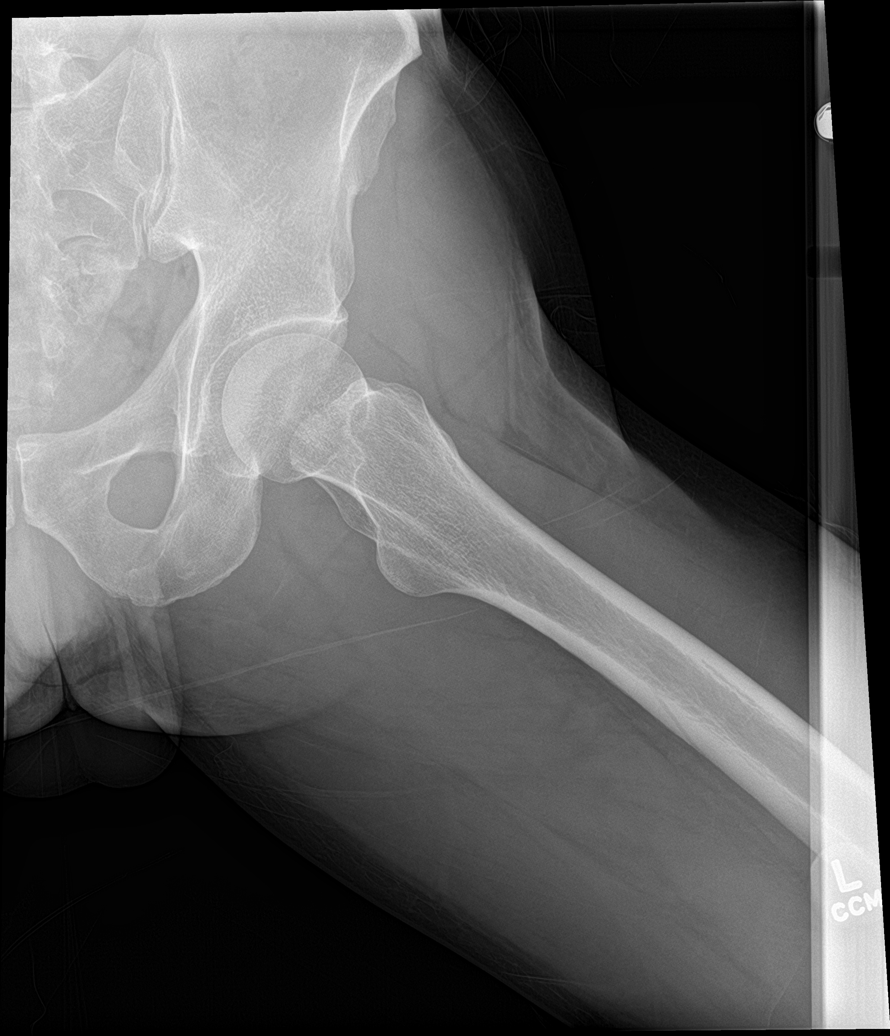

[hip lat (2 of 2)]
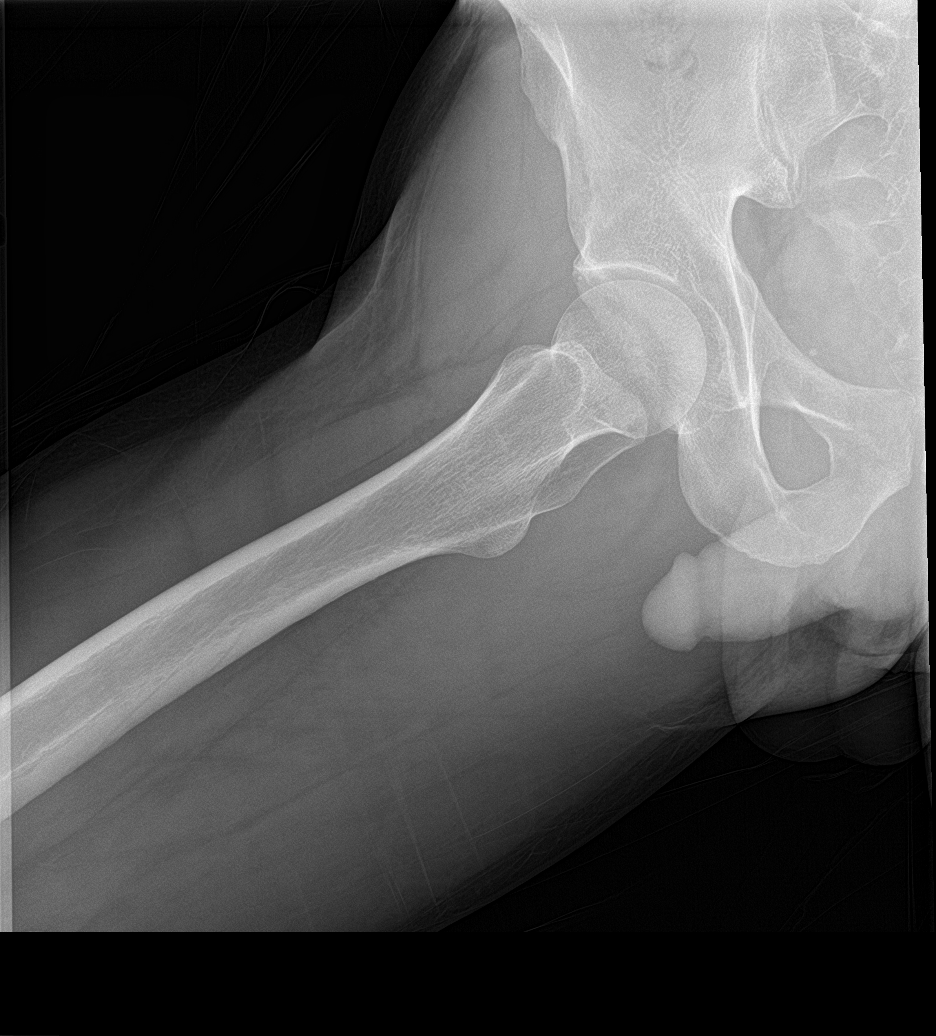

[5 of 5 positions shown; findings below may reference images not displayed]

FINDINGS: There is no evidence of hip fracture or dislocation. There is no
evidence of arthropathy or other focal bone abnormality.
IMPRESSION: Negative.
